# Patient Record
Sex: Male | Born: 1965 | Race: White | Hispanic: No | Marital: Single | Smoking: Former smoker
Health system: Southern US, Community
[De-identification: ages and names within clinical notes are randomized; demographics above are authoritative.]

## PROBLEM LIST (undated history)

## (undated) DIAGNOSIS — F419 Anxiety disorder, unspecified: Secondary | ICD-10-CM

## (undated) DIAGNOSIS — I10 Essential (primary) hypertension: Secondary | ICD-10-CM

## (undated) DIAGNOSIS — S30822A Blister (nonthermal) of penis, initial encounter: Secondary | ICD-10-CM

## (undated) DIAGNOSIS — F32A Depression, unspecified: Secondary | ICD-10-CM

## (undated) DIAGNOSIS — F329 Major depressive disorder, single episode, unspecified: Secondary | ICD-10-CM

## (undated) HISTORY — PX: NO PAST SURGERIES: SHX2092

---

## 2014-12-09 ENCOUNTER — Encounter (HOSPITAL_COMMUNITY): Payer: Self-pay | Admitting: Emergency Medicine

## 2014-12-09 DIAGNOSIS — I10 Essential (primary) hypertension: Secondary | ICD-10-CM | POA: Insufficient documentation

## 2014-12-09 DIAGNOSIS — Z79899 Other long term (current) drug therapy: Secondary | ICD-10-CM | POA: Insufficient documentation

## 2014-12-09 DIAGNOSIS — F1310 Sedative, hypnotic or anxiolytic abuse, uncomplicated: Secondary | ICD-10-CM | POA: Insufficient documentation

## 2014-12-09 DIAGNOSIS — F419 Anxiety disorder, unspecified: Secondary | ICD-10-CM | POA: Insufficient documentation

## 2014-12-09 NOTE — ED Notes (Signed)
Pt states he had episode of anxiousness, paranoia, and depression. Pt states when he gets like this he is likely to hurt himself. Pt does not have a plan at this time.

## 2014-12-10 ENCOUNTER — Encounter (HOSPITAL_COMMUNITY): Payer: Self-pay | Admitting: Behavioral Health

## 2014-12-10 ENCOUNTER — Emergency Department (HOSPITAL_COMMUNITY)
Admission: EM | Admit: 2014-12-10 | Discharge: 2014-12-10 | Disposition: A | Discharge status: Other Acute Inpatient Hospital | Payer: Self-pay | Attending: Emergency Medicine | Admitting: Emergency Medicine

## 2014-12-10 ENCOUNTER — Inpatient Hospital Stay (HOSPITAL_COMMUNITY)
Admission: EM | Admit: 2014-12-10 | Discharge: 2014-12-16 | DRG: 885 | Disposition: A | Discharge status: Home / Self Care | Payer: 59 | Source: Intra-hospital | Attending: Psychiatry | Admitting: Psychiatry

## 2014-12-10 DIAGNOSIS — F1099 Alcohol use, unspecified with unspecified alcohol-induced disorder: Secondary | ICD-10-CM

## 2014-12-10 DIAGNOSIS — Z818 Family history of other mental and behavioral disorders: Secondary | ICD-10-CM

## 2014-12-10 DIAGNOSIS — F1021 Alcohol dependence, in remission: Secondary | ICD-10-CM | POA: Diagnosis present

## 2014-12-10 DIAGNOSIS — F1320 Sedative, hypnotic or anxiolytic dependence, uncomplicated: Secondary | ICD-10-CM | POA: Diagnosis present

## 2014-12-10 DIAGNOSIS — I10 Essential (primary) hypertension: Secondary | ICD-10-CM | POA: Diagnosis present

## 2014-12-10 DIAGNOSIS — F411 Generalized anxiety disorder: Secondary | ICD-10-CM | POA: Diagnosis present

## 2014-12-10 DIAGNOSIS — Z590 Homelessness: Secondary | ICD-10-CM

## 2014-12-10 DIAGNOSIS — Z87891 Personal history of nicotine dependence: Secondary | ICD-10-CM

## 2014-12-10 DIAGNOSIS — F333 Major depressive disorder, recurrent, severe with psychotic symptoms: Principal | ICD-10-CM | POA: Diagnosis present

## 2014-12-10 DIAGNOSIS — R7989 Other specified abnormal findings of blood chemistry: Secondary | ICD-10-CM | POA: Clinically undetermined

## 2014-12-10 DIAGNOSIS — E785 Hyperlipidemia, unspecified: Secondary | ICD-10-CM | POA: Diagnosis present

## 2014-12-10 DIAGNOSIS — F1390 Sedative, hypnotic, or anxiolytic use, unspecified, uncomplicated: Secondary | ICD-10-CM | POA: Diagnosis not present

## 2014-12-10 DIAGNOSIS — R443 Hallucinations, unspecified: Secondary | ICD-10-CM

## 2014-12-10 DIAGNOSIS — G4700 Insomnia, unspecified: Secondary | ICD-10-CM | POA: Diagnosis present

## 2014-12-10 DIAGNOSIS — R45851 Suicidal ideations: Secondary | ICD-10-CM

## 2014-12-10 DIAGNOSIS — F410 Panic disorder [episodic paroxysmal anxiety] without agoraphobia: Secondary | ICD-10-CM | POA: Diagnosis present

## 2014-12-10 HISTORY — DX: Blister (nonthermal) of penis, initial encounter: S30.822A

## 2014-12-10 HISTORY — DX: Essential (primary) hypertension: I10

## 2014-12-10 HISTORY — DX: Anxiety disorder, unspecified: F41.9

## 2014-12-10 HISTORY — DX: Major depressive disorder, single episode, unspecified: F32.9

## 2014-12-10 HISTORY — DX: Depression, unspecified: F32.A

## 2014-12-10 LAB — CBC WITH DIFFERENTIAL/PLATELET
BASOS PCT: 1 % (ref 0–1)
Basophils Absolute: 0.1 10*3/uL (ref 0.0–0.1)
EOS ABS: 0.5 10*3/uL (ref 0.0–0.7)
EOS PCT: 5 % (ref 0–5)
HCT: 43.2 % (ref 39.0–52.0)
Hemoglobin: 15 g/dL (ref 13.0–17.0)
Lymphocytes Relative: 31 % (ref 12–46)
Lymphs Abs: 3.4 10*3/uL (ref 0.7–4.0)
MCH: 33.9 pg (ref 26.0–34.0)
MCHC: 34.7 g/dL (ref 30.0–36.0)
MCV: 97.5 fL (ref 78.0–100.0)
MONOS PCT: 8 % (ref 3–12)
Monocytes Absolute: 0.9 10*3/uL (ref 0.1–1.0)
NEUTROS ABS: 5.9 10*3/uL (ref 1.7–7.7)
Neutrophils Relative %: 55 % (ref 43–77)
PLATELETS: 225 10*3/uL (ref 150–400)
RBC: 4.43 MIL/uL (ref 4.22–5.81)
RDW: 12.9 % (ref 11.5–15.5)
WBC: 10.8 10*3/uL — AB (ref 4.0–10.5)

## 2014-12-10 LAB — BASIC METABOLIC PANEL
Anion gap: 10 (ref 5–15)
BUN: 21 mg/dL — ABNORMAL HIGH (ref 6–20)
CO2: 24 mmol/L (ref 22–32)
Calcium: 9 mg/dL (ref 8.9–10.3)
Chloride: 105 mmol/L (ref 101–111)
Creatinine, Ser: 1.15 mg/dL (ref 0.61–1.24)
Glucose, Bld: 95 mg/dL (ref 65–99)
POTASSIUM: 3.6 mmol/L (ref 3.5–5.1)
Sodium: 139 mmol/L (ref 135–145)

## 2014-12-10 LAB — RAPID URINE DRUG SCREEN, HOSP PERFORMED
Amphetamines: NOT DETECTED
Barbiturates: NOT DETECTED
Benzodiazepines: POSITIVE — AB
Cocaine: NOT DETECTED
Opiates: NOT DETECTED
Tetrahydrocannabinol: NOT DETECTED

## 2014-12-10 LAB — ACETAMINOPHEN LEVEL: Acetaminophen (Tylenol), Serum: <10 ug/mL — ABNORMAL LOW (ref 10–30)

## 2014-12-10 LAB — SALICYLATE LEVEL

## 2014-12-10 LAB — ETHANOL

## 2014-12-10 MED ORDER — HYDROXYZINE HCL 25 MG PO TABS
25.0000 mg | ORAL_TABLET | Freq: Four times a day (QID) | ORAL | Status: DC | PRN
  Administered 2014-12-10 – 2014-12-15 (×6): 25 mg via ORAL
  Filled 2014-12-10 (×3): qty 1
  Filled 2014-12-10: qty 20
  Filled 2014-12-10 (×3): qty 1

## 2014-12-10 MED ORDER — LOSARTAN POTASSIUM 50 MG PO TABS
100.0000 mg | ORAL_TABLET | Freq: Every day | ORAL | Status: DC
  Administered 2014-12-10 – 2014-12-16 (×7): 100 mg via ORAL
  Filled 2014-12-10 (×2): qty 2
  Filled 2014-12-10: qty 28
  Filled 2014-12-10 (×6): qty 2

## 2014-12-10 MED ORDER — CARVEDILOL 12.5 MG PO TABS
12.5000 mg | ORAL_TABLET | Freq: Two times a day (BID) | ORAL | Status: DC
  Administered 2014-12-10 – 2014-12-16 (×13): 12.5 mg via ORAL
  Filled 2014-12-10 (×8): qty 1
  Filled 2014-12-10: qty 28
  Filled 2014-12-10 (×7): qty 1
  Filled 2014-12-10: qty 28

## 2014-12-10 MED ORDER — FLUOXETINE HCL 20 MG PO TABS
20.0000 mg | ORAL_TABLET | Freq: Every day | ORAL | Status: DC
  Administered 2014-12-10 – 2014-12-11 (×2): 20 mg via ORAL
  Filled 2014-12-10 (×3): qty 1

## 2014-12-10 MED ORDER — ALUM & MAG HYDROXIDE-SIMETH 200-200-20 MG/5ML PO SUSP: 30.0000 mL | ORAL | Status: DC | PRN

## 2014-12-10 MED ORDER — CHLORDIAZEPOXIDE HCL 25 MG PO CAPS
25.0000 mg | ORAL_CAPSULE | Freq: Four times a day (QID) | ORAL | Status: DC | PRN
  Administered 2014-12-14: 25 mg via ORAL
  Filled 2014-12-10: qty 1

## 2014-12-10 MED ORDER — MAGNESIUM HYDROXIDE 400 MG/5ML PO SUSP: 30.0000 mL | Freq: Every day | ORAL | Status: DC | PRN

## 2014-12-10 MED ORDER — TRAZODONE HCL 50 MG PO TABS: 50.0000 mg | ORAL_TABLET | Freq: Every evening | ORAL | Status: DC | PRN

## 2014-12-10 MED ORDER — VITAMIN B-1 100 MG PO TABS
100.0000 mg | ORAL_TABLET | Freq: Every day | ORAL | Status: DC
  Administered 2014-12-10 – 2014-12-16 (×7): 100 mg via ORAL
  Filled 2014-12-10: qty 14
  Filled 2014-12-10 (×8): qty 1

## 2014-12-10 MED ORDER — HYDROCHLOROTHIAZIDE 12.5 MG PO CAPS
12.5000 mg | ORAL_CAPSULE | Freq: Every day | ORAL | Status: DC
  Administered 2014-12-10 – 2014-12-16 (×7): 12.5 mg via ORAL
  Filled 2014-12-10 (×13): qty 1

## 2014-12-10 MED ORDER — ACETAMINOPHEN 325 MG PO TABS
650.0000 mg | ORAL_TABLET | Freq: Four times a day (QID) | ORAL | Status: DC | PRN
  Administered 2014-12-13: 650 mg via ORAL
  Filled 2014-12-10: qty 2

## 2014-12-10 NOTE — ED Notes (Addendum)
Pt. Tearful. Pt. Reports that he was recently discharged from [facility] for detox. Pt. Currently living at a halfway house. Pt. Reports that he was cleaning the kitchen and overheard some other residents talking about the kitchen. Pt. Reports he suddenly became anxious and paranoid and heard a male voice in his ear. Pt. Reports then having thoughts of cutting his pinky finger off.

## 2014-12-10 NOTE — Progress Notes (Signed)
49 y/o who presents voluntarily for SI, auditory hallucinations, and hx of substance abuse.  Patient reports that he was recently discharged from [facility] for detox and currently living at a halfway house. Pt. Reports that he was cleaning the kitchen and overheard some other residents talking about the kitchen. Pt. Reports he suddenly became anxious and paranoid and heard a male voice in his ear. Pt. Reports then having thoughts of cutting his pinky finger off.  Patient states he has not heard voices since 2003.  Patient states he has been clean from drinking ETOH and using xanax since November 20, 2014.  Patient currently denies SI but states he was on admission to [facility].  Patient denies HI and states she hears a woman's voice telling him to harm himself.   Patient states he had a sister who is supportive.  Patient skin searched and he has a sore under Penis that he states is healing.  Patient states the Doctor at [facility] drew blood and states it was not an STD. Consents obtained, fall safety plan explained and patient verbalized understanding.  Food and fluids offered.  Patient belongings secured in locker #2.  Patient escorted and oriented to the unit.  Patient offered no additional questions or concerns.

## 2014-12-10 NOTE — [facility] Group Notes (Signed)
[facility] LCSW Aftercare Discharge Planning Group Note   12/10/2014 10:59 AM  Participation Quality:  Invited.  In bed asleep.    Daryel Gerald B

## 2014-12-10 NOTE — Progress Notes (Signed)
D: Pt has anxious affect and mood.  Pt reports his day has been "very good."  Pt reports he did not have a goal today, stating "I got in early in the morning."  Pt reports "I had a much better day, I've felt really good."  Pt denies SI/HI, denies hallucinations, denies pain.  Pt stayed in his room for the majority of the evening.  He did not attend evening group.  Pt denies withdrawal symptoms.     A: Introduced self to pt.  Met with pt 1:1 and provided support and encouragement.  Actively listened to pt.  PRN medication administered for anxiety. R: Pt is compliant with medications.  Pt verbally contracts for safety.  He reports he will notify staff of needs and concerns.  Will continue to monitor and assess.

## 2014-12-10 NOTE — Progress Notes (Signed)
Patient ID: Ansumana Gens, male   DOB: 1965/12/12, 49 y.o.   MRN: [id number]   Pt currently presents with a flat affect and euthymic behavior. Pt states "I'm doing better today, I had an episode of paranoia last night. I don't know what happened. Pt only complains of a small healing ulcer on the bottom of his penis. Pt denies pain/drianage or erythema.   Pt provided with medications per providers orders. Pt's labs and vitals were monitored throughout the day. Pt supported emotionally and encouraged to express concerns and questions. Pt educated on medications and diet/nutrition. MD, NP and writer consulted with pt about ulcer on pt's penis. UA drawn and labs scheduled for STI tests. SW notified that the director of [employer] called to inquire about pt.   Pt's safety ensured with 15 minute and environmental checks. Pt currently denies SI/HI and A/V hallucinations. Pt verbally agrees to seek staff if SI/HI or A/VH occurs and to consult with staff before acting on these thoughts. Will continue POC.
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
## 2014-12-10 NOTE — H&P (Signed)
Psychiatric Admission Assessment Adult  Patient Identification: Ruben Marks MRN:  [id number] Date of Evaluation:  12/10/2014 Chief Complaint:  Depressive Disorder Principal Diagnosis: MDD (major depressive disorder), recurrent, severe, with psychosis Diagnosis:   Patient Active Problem List   Diagnosis Date Noted  . MDD (major depressive disorder), recurrent, severe, with psychosis [F33.3] 12/10/2014  . GAD (generalized anxiety disorder) [F41.1] 12/10/2014  . Alcohol use disorder, severe, in early remission [F10.99] 12/10/2014  . Moderate benzodiazepine use disorder [F13.90] 12/10/2014      History of Present Illness::Ruben Marks is a 49 y.o.divorced white male , homeless , unemployed - was living at the [facility] in [place] since Tuesday June 15 th , who was brought to the [facility] at his own request by his house director at the recovery house .  Per initial notes in EHR " Pt reported that he has recently been IP at [facility] for alcohol detox beginning on 11/20/14. Pt reported having anxiety symptoms, similar to a panic attack, when he overheard two housemates talking and assumed they were discussing him. Pt reported that for the last two days he has been fighting the urge to cut his "pinkie finger" off. Pt reported that after hearing his housemates talking, he began having SI and thinking about cutting his wrists. Pt denies HI. Pt reported that he was hearing "a male voice" telling him that his housemates were talking about him and that he should kill himself. Pt reported hearing the same male voice several times before but years apart, the first time occuring in 1998. Pt denies any substances use since his recent hospital discharge. Pt has most symptoms of depression and many of anxiety. ETOH level when tested tonight was <5. UDS showed positive for Benzos; negative for all else. "  Patient seen this after noon. Pt appeared to be depressed , anxious. Pt reported  that he started feeling very paranoid to the point that he heard a male voice telling him that his house mates were talking about him. Pt reports that his last use of alcohol was a was prior to going to [place] and also his last use of BZD was 2 weeks ago , while at [place]. Pt reports that he was taking Prozac and trazodone as prescribed by his psychiatrist at [facility] - but he was feeling very tired , sad, slowed down, low energy , as well as tearfulness on and off since the past 1 week. Pt also reports feeling like "death wishes " so that this feeling would end. Patient also reports a hx of GAD- has worry about everything in general as well as racing heart , tremors and chest pain and SOB as well as panic like sx on and off .  Pt reports hearing AH may be a few times in his life and the last one was recently prior to admission.  Pt denies suicide attempts in the past . Pt did have another admission prior to being admitted at [facility] - this was at [facility] - 2012.  Pt is unemployed - homeless- used to live at his sister's house - cannot go back due to his erratic behavior. Pt reports he still has a bed available at [facility] .        Elements:  Location:  depression , anxiety,paranoia. Quality:  see above. Severity:  severe. Timing:  acute. Duration:  diagnosed with GAD 20 yrs ago , recent worsening of affective sx. Context:  depression, anxiety along with substance abuse. Associated  Signs/Symptoms: Depression Symptoms:  depressed mood, anhedonia, psychomotor retardation, fatigue, feelings of worthlessness/guilt, difficulty concentrating, hopelessness, recurrent thoughts of death, anxiety, panic attacks, loss of energy/fatigue, decreased appetite, (Hypo) Manic Symptoms:  Impulsivity, Anxiety Symptoms:  Excessive Worry, Psychotic Symptoms:  Delusions, Hallucinations: Auditory Paranoia, PTSD Symptoms: Negative Total Time spent with patient: 1 hour  Past  Medical History:  Past Medical History  Diagnosis Date  . Hypertension   . 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 Anxiety   . Depression    History reviewed. No pertinent past surgical history. Family History:  Family History  Problem Relation Age of Onset  . Anxiety disorder Mother    Social History:  History  Alcohol Use  . Yes    Comment: just 11/20/2014     History  Drug Use  . Yes  . Special: Benzodiazepines    History   Social History  . Marital Status: Unknown    Spouse Name: N/A  . Number of Children: N/A  . Years of Education: N/A   Social History Main Topics  . Smoking status: Former Research scientist (life sciences)  . Smokeless tobacco: Not on file  . Alcohol Use: Yes     Comment: just 11/20/2014  . Drug Use: Yes    Special: Benzodiazepines  . Sexual Activity: No   Other Topics Concern  . None   Social History Narrative   Additional Social History:    Pain Medications: n/a Prescriptions: see pta med list History of alcohol / drug use?: Yes Longest period of sobriety (when/how long): 8.5 years Negative Consequences of Use: Work / Youth worker, Museum/gallery curator, Scientist, research (physical sciences), Personal relationships Withdrawal Symptoms: Sweats Name of Substance 1: Alcohol 1 - Age of First Use: 17 1 - Amount (size/oz): minimum 1 pint of 151 and 4-5 hard lemonaides 1 - Frequency: daily 1 - Duration: years 1 - Last Use / Amount: 11/20/14 Name of Substance 2: Xanax 2 - Age of First Use: 29 2 - Amount (size/oz): "varied" 2 - Frequency: daily 2 - Duration: years 2 - Last Use / Amount: May 2016 Name of Substance 3: Benzos (Valium) 3 - Age of First Use: 29 3 - Amount (size/oz): "varied" 3 - Frequency: daily 3 - Duration: years 3 - Last Use / Amount: 11/20/14            Patient was born in [place]. Patient was raised by both parents . Pt dropped out of school got GED. Pt is divorced, reports having no children , is unemployed .   Musculoskeletal: Strength & Muscle Tone: within normal limits Gait & Station:  normal Patient leans: N/A  Psychiatric Specialty Exam: Physical Exam  Constitutional: He is oriented to person, place, and time. He appears well-developed and well-nourished.  HENT:  Head: Normocephalic and atraumatic.  Eyes: Conjunctivae and EOM are normal.  Neck: Normal range of motion. Neck supple. No thyromegaly present.  Cardiovascular: Normal rate and regular rhythm.   Respiratory: Effort normal.  GI: Soft. He exhibits no distension.  Genitourinary: Penis normal.  HAS A RASH ON THE SIDE OF THE PENIS - reports it as improving  Musculoskeletal: Normal range of motion.  Neurological: He is alert and oriented to person, place, and time.  Skin: Skin is warm. Rash noted.  Psychiatric: His mood appears anxious. His speech is delayed. He is slowed. Thought content is paranoid. Cognition and memory are normal. He expresses impulsivity. He exhibits a depressed mood. He expresses suicidal ideation.    Review of Systems  Skin: Positive for rash.  Psychiatric/Behavioral: Positive for depression, suicidal ideas and substance abuse. The patient is nervous/anxious and has insomnia.   All other systems reviewed and are negative.   Blood pressure 129/87, pulse 81, temperature 97.7 F (36.5 C), temperature source Oral, resp. rate 20, height _0  (1.905 m), weight 109.317 kg (241 lb), SpO2 97 %.Body mass index is 30.12 kg/(m^2).  General Appearance: Disheveled  Eye Sport and exercise psychologist::  Fair  Speech:  Normal Rate  Volume:  Normal  Mood:  Anxious and Depressed  Affect:  Congruent  Thought Process:  Goal Directed  Orientation:  Full (Time, Place, and Person)  Thought Content:  Delusions, Hallucinations: Auditory, Ideas of Reference:   Paranoia, Paranoid Ideation and Rumination  Suicidal Thoughts:  has death wishes  Homicidal Thoughts:  No  Memory:  Immediate;   Fair Recent;   Fair Remote;   Fair  Judgement:  Impaired  Insight:  Fair  Psychomotor Activity:  Normal  Concentration:  Fair  Recall:   [employer] of Knowledge:Fair  Language: Fair  Akathisia:  No  Handed:  Right  AIMS (if indicated):     Assets:  Communication Skills Desire for Improvement  ADL's:  Intact  Cognition: WNL  Sleep:      Risk to Self: Is patient at risk for suicide?: Yes What has been your use of drugs/alcohol within the last 12 months?: daily until Pt was detoxed on 11/20/14 Risk to Others:  paranoid , potential Prior Inpatient Therapy:  yes- as above Prior Outpatient Therapy:  yes -   Alcohol Screening: 1. How often do you have a drink containing alcohol?: Monthly or less 2. How many drinks containing alcohol do you have on a typical day when you are drinking?: 1 or 2 3. How often do you have six or more drinks on one occasion?: Never Preliminary Score: 0 9. Have you or someone else been injured as a result of your drinking?: No 10. Has a relative or friend or a doctor or another health worker been concerned about your drinking or suggested you cut down?: Yes, but not in the last year Alcohol Use Disorder Identification Test Final Score (AUDIT): 3 Brief Intervention: Patient declined brief intervention  Allergies:  No Known Allergies Lab Results:  Results for orders placed or performed during the hospital encounter of 12/10/14 (from the past 48 hour(s))  Urine rapid drug screen (hosp performed)     Status: Abnormal   Collection Time: 12/10/14 12:18 AM  Result Value Ref Range   Opiates NONE DETECTED NONE DETECTED   Cocaine NONE DETECTED NONE DETECTED   Benzodiazepines POSITIVE (A) NONE DETECTED   Amphetamines NONE DETECTED NONE DETECTED   Tetrahydrocannabinol NONE DETECTED NONE DETECTED   Barbiturates NONE DETECTED NONE DETECTED    Comment:        DRUG SCREEN FOR MEDICAL PURPOSES ONLY.  IF CONFIRMATION IS NEEDED FOR ANY PURPOSE, NOTIFY LAB WITHIN 5 DAYS.        LOWEST DETECTABLE LIMITS FOR URINE DRUG SCREEN Drug Class       Cutoff (ng/mL) Amphetamine      1000 Barbiturate       200 Benzodiazepine   379 Tricyclics       024 Opiates          300 Cocaine          300 THC              50   CBC with Differential     Status: Abnormal  Collection Time: 12/10/14 12:30 AM  Result Value Ref Range   WBC 10.8 (H) 4.0 - 10.5 K/uL   RBC 4.43 4.22 - 5.81 MIL/uL   Hemoglobin 15.0 13.0 - 17.0 g/dL   HCT 43.2 39.0 - 52.0 %   MCV 97.5 78.0 - 100.0 fL   MCH 33.9 26.0 - 34.0 pg   MCHC 34.7 30.0 - 36.0 g/dL   RDW 12.9 11.5 - 15.5 %   Platelets 225 150 - 400 K/uL   Neutrophils Relative % 55 43 - 77 %   Neutro Abs 5.9 1.7 - 7.7 K/uL   Lymphocytes Relative 31 12 - 46 %   Lymphs Abs 3.4 0.7 - 4.0 K/uL   Monocytes Relative 8 3 - 12 %   Monocytes Absolute 0.9 0.1 - 1.0 K/uL   Eosinophils Relative 5 0 - 5 %   Eosinophils Absolute 0.5 0.0 - 0.7 K/uL   Basophils Relative 1 0 - 1 %   Basophils Absolute 0.1 0.0 - 0.1 K/uL  Basic metabolic panel     Status: Abnormal   Collection Time: 12/10/14 12:30 AM  Result Value Ref Range   Sodium 139 135 - 145 mmol/L   Potassium 3.6 3.5 - 5.1 mmol/L   Chloride 105 101 - 111 mmol/L   CO2 24 22 - 32 mmol/L   Glucose, Bld 95 65 - 99 mg/dL   BUN 21 (H) 6 - 20 mg/dL   Creatinine, Ser 1.15 0.61 - 1.24 mg/dL   Calcium 9.0 8.9 - 10.3 mg/dL   GFR calc non Af Amer >60 >60 mL/min   GFR calc Af Amer >60 >60 mL/min    Comment: (NOTE) The eGFR has been calculated using the CKD EPI equation. This calculation has not been validated in all clinical situations. eGFR's persistently <60 mL/min signify possible Chronic Kidney Disease.    Anion gap 10 5 - 15  Ethanol     Status: None   Collection Time: 12/10/14 12:30 AM  Result Value Ref Range   Alcohol, Ethyl (B) <5 <5 mg/dL    Comment:        LOWEST DETECTABLE LIMIT FOR SERUM ALCOHOL IS 5 mg/dL FOR MEDICAL PURPOSES ONLY   Salicylate level     Status: None   Collection Time: 12/10/14 12:30 AM  Result Value Ref Range   Salicylate Lvl <1.6 2.8 - 30.0 mg/dL  Acetaminophen level     Status:  Abnormal   Collection Time: 12/10/14 12:30 AM  Result Value Ref Range   Acetaminophen (Tylenol), Serum <10 (L) 10 - 30 ug/mL    Comment:        THERAPEUTIC CONCENTRATIONS VARY SIGNIFICANTLY. A RANGE OF 10-30 ug/mL MAY BE AN EFFECTIVE CONCENTRATION FOR MANY PATIENTS. HOWEVER, SOME ARE BEST TREATED AT CONCENTRATIONS OUTSIDE THIS RANGE. ACETAMINOPHEN CONCENTRATIONS >150 ug/mL AT 4 HOURS AFTER INGESTION AND >50 ug/mL AT 12 HOURS AFTER INGESTION ARE OFTEN ASSOCIATED WITH TOXIC REACTIONS.    Current Medications: Current Facility-Administered Medications  Medication Dose Route Frequency Provider Last Rate Last Dose  . acetaminophen (TYLENOL) tablet 650 mg  650 mg Oral Q6H PRN Laverle Hobby, PA-C      . alum & mag hydroxide-simeth (MAALOX/MYLANTA) 200-200-20 MG/5ML suspension 30 mL  30 mL Oral Q4H PRN Laverle Hobby, PA-C      . carvedilol (COREG) tablet 12.5 mg  12.5 mg Oral BID WC Laverle Hobby, PA-C   12.5 mg at 12/10/14 0841  . chlordiazePOXIDE (LIBRIUM) capsule 25 mg  25  mg Oral QID PRN Ursula Alert, MD      . FLUoxetine (PROZAC) tablet 20 mg  20 mg Oral Daily Laverle Hobby, PA-C   20 mg at 12/10/14 0841  . hydrochlorothiazide (MICROZIDE) capsule 12.5 mg  12.5 mg Oral Daily Vaden Becherer, MD   12.5 mg at 12/10/14 0841  . hydrOXYzine (ATARAX/VISTARIL) tablet 25 mg  25 mg Oral Q6H PRN Laverle Hobby, PA-C      . losartan (COZAAR) tablet 100 mg  100 mg Oral Daily Laverle Hobby, PA-C   100 mg at 12/10/14 0841  . magnesium hydroxide (MILK OF MAGNESIA) suspension 30 mL  30 mL Oral Daily PRN Laverle Hobby, PA-C      . thiamine (VITAMIN B-1) tablet 100 mg  100 mg Oral Daily Laverle Hobby, PA-C   100 mg at 12/10/14 7829   PTA Medications: Prescriptions prior to admission  Medication Sig Dispense Refill Last Dose  . carvedilol (COREG) 12.5 MG tablet Take 12.5 mg by mouth 2 (two) times daily with a meal.   12/09/2014 at Unknown time  . FLUoxetine (PROZAC) 10 MG capsule Take 10  mg by mouth daily.   12/09/2014  . losartan-hydrochlorothiazide (HYZAAR) 100-12.5 MG per tablet Take 1 tablet by mouth daily.   12/09/2014 at Unknown time  . thiamine 100 MG tablet Take 100 mg by mouth daily.   12/09/2014 at Unknown time  . traZODone (DESYREL) 50 MG tablet Take 50 mg by mouth at bedtime.   12/09/2014 at Unknown time    Previous Psychotropic Medications: Yes - PROZAC, TRAZODONE  Substance Abuse History in the last 12 months:  Yes.  Alcohol abuse, bzd abuse     Consequences of Substance Abuse: Medical Consequences:  admissions to hospitals Family Consequences:  relational struggles  Results for orders placed or performed during the hospital encounter of 12/10/14 (from the past 72 hour(s))  Urine rapid drug screen (hosp performed)     Status: Abnormal   Collection Time: 12/10/14 12:18 AM  Result Value Ref Range   Opiates NONE DETECTED NONE DETECTED   Cocaine NONE DETECTED NONE DETECTED   Benzodiazepines POSITIVE (A) NONE DETECTED   Amphetamines NONE DETECTED NONE DETECTED   Tetrahydrocannabinol NONE DETECTED NONE DETECTED   Barbiturates NONE DETECTED NONE DETECTED    Comment:        DRUG SCREEN FOR MEDICAL PURPOSES ONLY.  IF CONFIRMATION IS NEEDED FOR ANY PURPOSE, NOTIFY LAB WITHIN 5 DAYS.        LOWEST DETECTABLE LIMITS FOR URINE DRUG SCREEN Drug Class       Cutoff (ng/mL) Amphetamine      1000 Barbiturate      200 Benzodiazepine   562 Tricyclics       130 Opiates          300 Cocaine          300 THC              50   CBC with Differential     Status: Abnormal   Collection Time: 12/10/14 12:30 AM  Result Value Ref Range   WBC 10.8 (H) 4.0 - 10.5 K/uL   RBC 4.43 4.22 - 5.81 MIL/uL   Hemoglobin 15.0 13.0 - 17.0 g/dL   HCT 43.2 39.0 - 52.0 %   MCV 97.5 78.0 - 100.0 fL   MCH 33.9 26.0 - 34.0 pg   MCHC 34.7 30.0 - 36.0 g/dL   RDW 12.9 11.5 - 15.5 %  Platelets 225 150 - 400 K/uL   Neutrophils Relative % 55 43 - 77 %   Neutro Abs 5.9 1.7 - 7.7 K/uL    Lymphocytes Relative 31 12 - 46 %   Lymphs Abs 3.4 0.7 - 4.0 K/uL   Monocytes Relative 8 3 - 12 %   Monocytes Absolute 0.9 0.1 - 1.0 K/uL   Eosinophils Relative 5 0 - 5 %   Eosinophils Absolute 0.5 0.0 - 0.7 K/uL   Basophils Relative 1 0 - 1 %   Basophils Absolute 0.1 0.0 - 0.1 K/uL  Basic metabolic panel     Status: Abnormal   Collection Time: 12/10/14 12:30 AM  Result Value Ref Range   Sodium 139 135 - 145 mmol/L   Potassium 3.6 3.5 - 5.1 mmol/L   Chloride 105 101 - 111 mmol/L   CO2 24 22 - 32 mmol/L   Glucose, Bld 95 65 - 99 mg/dL   BUN 21 (H) 6 - 20 mg/dL   Creatinine, Ser 1.15 0.61 - 1.24 mg/dL   Calcium 9.0 8.9 - 10.3 mg/dL   GFR calc non Af Amer >60 >60 mL/min   GFR calc Af Amer >60 >60 mL/min    Comment: (NOTE) The eGFR has been calculated using the CKD EPI equation. This calculation has not been validated in all clinical situations. eGFR's persistently <60 mL/min signify possible Chronic Kidney Disease.    Anion gap 10 5 - 15  Ethanol     Status: None   Collection Time: 12/10/14 12:30 AM  Result Value Ref Range   Alcohol, Ethyl (B) <5 <5 mg/dL    Comment:        LOWEST DETECTABLE LIMIT FOR SERUM ALCOHOL IS 5 mg/dL FOR MEDICAL PURPOSES ONLY   Salicylate level     Status: None   Collection Time: 12/10/14 12:30 AM  Result Value Ref Range   Salicylate Lvl <1.2 2.8 - 30.0 mg/dL  Acetaminophen level     Status: Abnormal   Collection Time: 12/10/14 12:30 AM  Result Value Ref Range   Acetaminophen (Tylenol), Serum <10 (L) 10 - 30 ug/mL    Comment:        THERAPEUTIC CONCENTRATIONS VARY SIGNIFICANTLY. A RANGE OF 10-30 ug/mL MAY BE AN EFFECTIVE CONCENTRATION FOR MANY PATIENTS. HOWEVER, SOME ARE BEST TREATED AT CONCENTRATIONS OUTSIDE THIS RANGE. ACETAMINOPHEN CONCENTRATIONS >150 ug/mL AT 4 HOURS AFTER INGESTION AND >50 ug/mL AT 12 HOURS AFTER INGESTION ARE OFTEN ASSOCIATED WITH TOXIC REACTIONS.     Observation Level/Precautions:  15 minute checks   Laboratory:  .  Psychotherapy:  Individual and group therapy   Medications:  As below  Consultations:  Education officer, museum  Discharge Concerns: stability and safety        Psychological Evaluations: No   Treatment Plan Summary: Daily contact with patient to assess and evaluate symptoms and progress in treatment and Medication management  Patient will benefit from inpatient treatment and stabilization.  Estimated length of stay is 5-7 days.  Reviewed past medical records,treatment plan.  Patient would like to be continues on Prozac 20 mg for affective sx. Will DC Trazodone for side effects. Will assess the need for an antipsychotic tomorrow- pt currently denies paranoia- reports it is better. CIWA/Librium prn for alcohol, BZD withdrawal sx- pt with UDS positive for BZD - reports he did not abuse it after [facility]. Will continue to monitor vitals ,medication compliance and treatment side effects while patient is here.  Will monitor for medical issues as  well as call consult as needed.  Reviewed labs CBC, CMP,UDS,BAL- will get RPR,GC/CHL, HIV, tsh. CSW will start working on disposition.  Patient to participate in therapeutic milieu .       Medical Decision Making:  Review of Psycho-Social Stressors (1), Review or order clinical lab tests (1), Review and summation of old records (2), Established Problem, Worsening (2), Review of Last Therapy Session (1), Review of Medication Regimen & Side Effects (2) and Review of New Medication or Change in Dosage (2)  I certify that inpatient services furnished can reasonably be expected to improve the patient's condition.   Daeshawn Redmann MD 6/22/20164:13 PM

## 2014-12-10 NOTE — [facility] Counselor (Signed)
Adult Comprehensive Assessment  Patient ID: Ruben Marks, male   DOB: 31-Jul-1965, 49 y.o.   MRN: [id number]  Information Source: Information source: Patient  Current Stressors:  Educational / Learning stressors: N/A Employment / Job issues: Currently unemployed Family Relationships: None reported  Surveyor, quantity / Lack of resources (include bankruptcy): No income currently Housing / Lack of housing: Living at [facility] Physical health (include injuries & life threatening diseases): None reported Social relationships: none reported Substance abuse: Pt states he has been sober since June 2, living at [facility] Bereavement / Loss: none reported  Living/Environment/Situation:  Living Arrangements: Other (Comment) (residents at [facility]) Living conditions (as described by patient or guardian): "fine" How long has patient lived in current situation?: 1 week; prior to living at [facility], Pt was living with his sister in [place], [place] What is atmosphere in current home: Supportive  Family History:  Marital status: Divorced Divorced, when?: 07/2014; Separated since 2012 What types of issues is patient dealing with in the relationship?: Pt reports that he still loves his ex-wife and it causes him to be upset at times Does patient have children?: No  Childhood History:  By whom was/is the patient raised?: Both parents Description of patient's relationship with caregiver when they were a child: Good relationship, safe childhood Patient's description of current relationship with people who raised him/her: Pt's parents are both deceased Does patient have siblings?: Yes Number of Siblings: 3 Description of patient's current relationship with siblings: Pt reports siblings are very supportive and that he has a close relationship with them. Did patient suffer any verbal/emotional/physical/sexual abuse as a child?: No Did patient suffer from severe childhood neglect?: No Has patient ever been sexually  abused/assaulted/raped as an adolescent or adult?: No Was the patient ever a victim of a crime or a disaster?: No Witnessed domestic violence?: No Has patient been effected by domestic violence as an adult?: No  Education:  Highest grade of school patient has completed: 12th Currently a student?: No Name of school: [employer] Learning disability?: No  Employment/Work Situation:   Employment situation: Unemployed What is the longest time patient has a held a job?: 7 years Where was the patient employed at that time?: [employer] Has patient ever been in the [employer]?: No Has patient ever served in Buyer, retail?: No  Financial Resources:   Surveyor, quantity resources: No income Does patient have a Lawyer or guardian?: No  Alcohol/Substance Abuse:   What has been your use of drugs/alcohol within the last 12 months?: daily until Pt was detoxed on 11/20/14 If attempted suicide, did drugs/alcohol play a role in this?: No Alcohol/Substance Abuse Treatment Hx: Past detox, Past Tx, Inpatient Has alcohol/substance abuse ever caused legal problems?: No  Social Support System:   Conservation officer, nature Support System: Good Describe Community Support System: siblings are supportive Type of faith/religion: Did not state How does patient's faith help to cope with current illness?: unknown  Leisure/Recreation:   Leisure and Hobbies: Unknown   Strengths/Needs:   What things does the patient do well?: Pt did not state In what areas does patient struggle / problems for patient: Pt did not state  Discharge Plan:   Does patient have access to transportation?: Yes Will patient be returning to same living situation after discharge?: Yes Currently receiving community mental health services:  (staff at [facility] provide services) If no, would patient like referral for services when discharged?: No Does patient have financial barriers related to discharge medications?: No  Summary/Recommendations:     Patient  is  a 49 year old Caucasian male admitted with SI and auditory hallucinations. Pt is currently living at [facility] in [facility] and reports that he can return there at discharge. Pt also stated that he is receiving mental health services at [facility].  He has a history of substance abuse but has been clean since 11/20/14 after receiving detox services at [facility]. Pt identified his sister as supportive and is agreeable to SPE with her. Patient will benefit from crisis stabilization, medication evaluation, group therapy and psycho education in addition to case management for discharge planning.     Elaina Hoops. 12/10/2014

## 2014-12-10 NOTE — Plan of Care (Signed)
Problem: Alteration in mood & ability to function due to Goal: LTG-Pt reports reduction in suicidal thoughts (Patient reports reduction in suicidal thoughts and is able to verbalize a safety plan for whenever patient is feeling suicidal)  Outcome: Progressing Pt denied SI this shift.  He verbally contracted for safety.   Goal: STG-Patient will attend groups Outcome: Not Progressing Pt did not attend evening group on 12/10/14.

## 2014-12-10 NOTE — [facility] Suicide Risk Assessment (Signed)
[facility] Admission Suicide Risk Assessment   Nursing information obtained from:  Patient Demographic factors:  Male, Unemployed, Low socioeconomic status Current Mental Status:  Suicidal ideation indicated by patient, Suicide plan, Self-harm thoughts Loss Factors:  Financial problems / change in socioeconomic status Historical Factors:  Prior suicide attempts, Family history of suicide Risk Reduction Factors:  Sense of responsibility to family, Living with another person, especially a relative, Positive social support Total Time spent with patient: 30 minutes Principal Problem: MDD (major depressive disorder), recurrent, severe, with psychosis Diagnosis:   Patient Active Problem List   Diagnosis Date Noted  . MDD (major depressive disorder), recurrent, severe, with psychosis [F33.3] 12/10/2014  . GAD (generalized anxiety disorder) [F41.1] 12/10/2014  . Alcohol use disorder, severe, in early remission [F10.99] 12/10/2014  . Moderate benzodiazepine use disorder [F13.90] 12/10/2014     Continued Clinical Symptoms:  Alcohol Use Disorder Identification Test Final Score (AUDIT): 3 The "Alcohol Use Disorders Identification Test", Guidelines for Use in Primary Care, Second Edition.  World Science writer [facility]). Score between 0-7:  no or low risk or alcohol related problems. Score between 8-15:  moderate risk of alcohol related problems. Score between 16-19:  high risk of alcohol related problems. Score 20 or above:  warrants further diagnostic evaluation for alcohol dependence and treatment.   CLINICAL FACTORS:   Depression:   Comorbid alcohol abuse/dependence Hopelessness Impulsivity Alcohol/Substance Abuse/Dependencies Previous Psychiatric Diagnoses and Treatments   Musculoskeletal: Strength & Muscle Tone: within normal limits Gait & Station: normal Patient leans: N/A  Psychiatric Specialty Exam: Physical Exam  Review of Systems  Skin: Positive for rash.       Lateral side of  penis  Psychiatric/Behavioral: Positive for depression, suicidal ideas and substance abuse. The patient is nervous/anxious and has insomnia.   All other systems reviewed and are negative.   Blood pressure 129/87, pulse 81, temperature 97.7 F (36.5 C), temperature source Oral, resp. rate 20, height 6\' 3"  (1.905 m), weight 109.317 kg (241 lb), SpO2 97 %.Body mass index is 30.12 kg/(m^2).              Please see H&P.                                            COGNITIVE FEATURES THAT CONTRIBUTE TO RISK:  Closed-mindedness, Polarized thinking and Thought constriction (tunnel vision)    SUICIDE RISK:   Moderate:  Frequent suicidal ideation with limited intensity, and duration, some specificity in terms of plans, no associated intent, good self-control, limited dysphoria/symptomatology, some risk factors present, and identifiable protective factors, including available and accessible social support.  PLAN OF CARE: Please see H&P.   Medical Decision Making:  Review of Psycho-Social Stressors (1), Review or order clinical lab tests (1), Established Problem, Worsening (2), Review of Last Therapy Session (1), Review of Medication Regimen & Side Effects (2) and Review of New Medication or Change in Dosage (2)  I certify that inpatient services furnished can reasonably be expected to improve the patient's condition.   Ilian Wessell MD 12/10/2014, 4:10 PM

## 2014-12-10 NOTE — ED Provider Notes (Addendum)
CSN: [id number]     Arrival date & time 12/09/14  2331 History  This chart was scribed for Shon Baton, MD by Abel Presto, ED Scribe. This patient was seen in room APA17/APA17 and the patient's care was started at 12:19 AM.    Chief Complaint  Patient presents with  . V70.1    The history is provided by the patient. No language interpreter was used.   HPI Comments: Ruben Marks is a 49 y.o. male with PMHx of HTN who presents to the Emergency Department complaining of episode of paranoia and auditory hallucinations. He states he has been hearing the same voice since 1998. Pt reports only PMHx of anxiety but no other psychiatric diagnoses. Pt states he has wanted to cut his finger off for 2 days. Pt was seen at [facility] for EtOH detox and kept in psych ward for 8 days. He denies drug use. He reports h/o addiction to Xanax and Valium - no recent history. Pt denies homicidal ideation. Pt has a psychiatrist. He denies any physical complaints.   Past Medical History  Diagnosis Date  . Hypertension    History reviewed. No pertinent past surgical history. History reviewed. No pertinent family history. History  Substance Use Topics  . Smoking status: Former Games developer  . Smokeless tobacco: Not on file  . Alcohol Use: No     Comment: just 11/20/2014    Review of Systems  Constitutional: Negative.  Negative for fever.  Respiratory: Negative.  Negative for chest tightness and shortness of breath.   Cardiovascular: Negative.  Negative for chest pain.  Gastrointestinal: Negative.  Negative for abdominal pain.  Genitourinary: Negative.   Skin: Negative for wound.  Neurological: Negative for headaches.  Psychiatric/Behavioral: Positive for suicidal ideas and hallucinations. Negative for self-injury and agitation. The patient is nervous/anxious.   All other systems reviewed and are negative.     Allergies  Review of patient's allergies indicates not on file.  Home Medications   Prior  to Admission medications   Medication Sig Start Date End Date Taking? Authorizing Provider  carvedilol (COREG) 12.5 MG tablet Take 12.5 mg by mouth 2 (two) times daily with a meal.   Yes Historical Provider, MD  FLUoxetine (PROZAC) 20 MG tablet Take 20 mg by mouth daily.   Yes Historical Provider, MD  losartan (COZAAR) 100 MG tablet Take 100 mg by mouth daily.   Yes Historical Provider, MD  thiamine 100 MG tablet Take 100 mg by mouth daily.   Yes Historical Provider, MD  traZODone (DESYREL) 50 MG tablet Take 50 mg by mouth at bedtime.   Yes Historical Provider, MD   BP 131/79 mmHg  Pulse 67  Temp(Src) 98.1 F (36.7 C) (Oral)  Resp 16  Ht 6\' 3"  (1.905 m)  Wt 246 lb (111.585 kg)  BMI 30.75 kg/m2  SpO2 97% Physical Exam  Constitutional: He is oriented to person, place, and time. He appears well-developed and well-nourished. No distress.  HENT:  Head: Normocephalic and atraumatic.  Cardiovascular: Normal rate, regular rhythm and normal heart sounds.   No murmur heard. Pulmonary/Chest: Effort normal and breath sounds normal. No respiratory distress. He has no wheezes.  Abdominal: Soft. There is no tenderness.  Musculoskeletal: He exhibits no edema.  Neurological: He is alert and oriented to person, place, and time.  Skin: Skin is warm and dry.  Psychiatric:  Anxious appearing, does not appear to be responding to internal stimuli  Nursing note and vitals reviewed.   ED Course  Procedures (including critical care time) DIAGNOSTIC STUDIES: Oxygen Saturation is 97% on room air, normal by my interpretation.    COORDINATION OF CARE: 12:24 AM Discussed treatment plan with patient at beside, the patient agrees with the plan and has no further questions at this time.   Labs Review Labs Reviewed  CBC WITH DIFFERENTIAL/PLATELET - Abnormal; Notable for the following:    WBC 10.8 (*)    All other components within normal limits  BASIC METABOLIC PANEL - Abnormal; Notable for the following:     BUN 21 (*)    All other components within normal limits  URINE RAPID DRUG SCREEN, HOSP PERFORMED - Abnormal; Notable for the following:    Benzodiazepines POSITIVE (*)    All other components within normal limits  ACETAMINOPHEN LEVEL - Abnormal; Notable for the following:    Acetaminophen (Tylenol), Serum <10 (*)    All other components within normal limits  ETHANOL  SALICYLATE LEVEL    Imaging Review No results found.   EKG Interpretation None      MDM   Final diagnoses:  Suicidal ideation  Hallucination    Patient presents with auditory hallucinations, anxiety, and feelings of wanting to hurt himself. [facility] Nontoxic on exam. Does not appear acutely psychotic.  Labs ordered and TTS consulted.   I personally performed the services described in this documentation, which was scribed in my presence. The recorded information has been reviewed and is accurate.     Shon Baton, MD 12/10/14 0119   Meets inpatient criteria. Accepted by Dr. Elna Breslow.  Shon Baton, MD 12/10/14 [id number]

## 2014-12-10 NOTE — ED Notes (Signed)
Pt. Calm and cooperative at time of transfer. No distress noted.

## 2014-12-10 NOTE — Tx Team (Addendum)
Initial Interdisciplinary Treatment Plan   PATIENT STRESSORS: Financial difficulties Substance abuse   PATIENT STRENGTHS: Ability for insight Communication skills General fund of knowledge Motivation for treatment/growth Supportive family/friends   PROBLEM LIST: Problem List/Patient Goals Date to be addressed Date deferred Reason deferred Estimated date of resolution  "I was hearing a male voice" 12/10/2014     "I was having thoughts of hurting myself by cutting off my pinky with an axe' 12/10/2014     Anxiety 12/10/2014     "I was living in a halfway house" 12/10/2014                                    DISCHARGE CRITERIA:  Ability to meet basic life and health needs Improved stabilization in mood, thinking, and/or behavior Motivation to continue treatment in a less acute level of care Reduction of life-threatening or endangering symptoms to within safe limits Safe-care adequate arrangements made  PRELIMINARY DISCHARGE PLAN: Attend aftercare/continuing care group Attend 12-step recovery group Return to previous living arrangement  PATIENT/FAMIILY INVOLVEMENT: This treatment plan has been presented to and reviewed with the patient, Ruben Marks.  The patient and family have been given the opportunity to ask questions and make suggestions.  [name] 12/10/2014, 6:27 AM

## 2014-12-10 NOTE — Progress Notes (Signed)
Did not attend group 

## 2014-12-11 LAB — TSH: TSH: 7.749 u[IU]/mL — ABNORMAL HIGH (ref 0.350–4.500)

## 2014-12-11 LAB — RPR: RPR Ser Ql: NONREACTIVE

## 2014-12-11 LAB — HIV ANTIBODY (ROUTINE TESTING W REFLEX): HIV Screen 4th Generation wRfx: NONREACTIVE

## 2014-12-11 LAB — GC/CHLAMYDIA PROBE AMP (~~LOC~~) NOT AT ARMC
CHLAMYDIA, DNA PROBE: NEGATIVE
NEISSERIA GONORRHEA: NEGATIVE

## 2014-12-11 MED ORDER — BENZTROPINE MESYLATE 0.5 MG PO TABS
0.5000 mg | ORAL_TABLET | Freq: Every day | ORAL | Status: DC
  Administered 2014-12-11 – 2014-12-14 (×2): 0.5 mg via ORAL
  Filled 2014-12-11 (×5): qty 1

## 2014-12-11 MED ORDER — RISPERIDONE 0.5 MG PO TABS
0.5000 mg | ORAL_TABLET | Freq: Every day | ORAL | Status: DC
  Administered 2014-12-11: 0.5 mg via ORAL
  Filled 2014-12-11 (×5): qty 1

## 2014-12-11 MED ORDER — FLUOXETINE HCL 20 MG PO TABS
40.0000 mg | ORAL_TABLET | Freq: Every day | ORAL | Status: DC
  Administered 2014-12-12 – 2014-12-16 (×5): 40 mg via ORAL
  Filled 2014-12-11 (×3): qty 2
  Filled 2014-12-11: qty 28
  Filled 2014-12-11 (×3): qty 2

## 2014-12-11 NOTE — [facility] Group Notes (Signed)
[facility] LCSW Group Therapy  12/11/2014 3:42 PM  Type of Therapy:  Group Therapy  Participation Level:  Active  Participation Quality:  Appropriate, Sharing and Supportive  Affect:  Appropriate  Cognitive:  Appropriate  Insight:  Engaged  Engagement in Therapy:  Engaged  Modes of Intervention:  Discussion, Exploration and Orientation  Summary of Progress/Problems:  Patient actively engaged in group session, supportive and sharing.  Identified "equality" as an example of balance, described need to accept difficult situations in life in order to move through them more effectively.  Describes his difficulties with isolation and lack of control in his life as major sources of distress.  Provided support and clarification for others in group in friendly, supportive manner.  Identifies a need to trust MDs and RNs more, being willing to consider medications and to ask questions in order to clarify his questions.    Sallee Lange 12/11/2014, 3:42 PM

## 2014-12-11 NOTE — Progress Notes (Signed)
[facility] MD Progress Note  12/11/2014 11:22 AM Ruben Marks  MRN:  [id number] Subjective: Patient states " I am still tired, depressed .'  Objective: Patient seen and chart reviewed.Discussed patient with treatment team. Patient presented after having a psychotic episode at recovery house. Pt today appears sad, anxious , seen in bed as isolative , Withdrawn. Pt denies any new concerns , states the penile rash is improving. However per staff pt has been seen as responding to internal stimuli. Will add a small dose of antipsychotic and re- evaluate . Pt denies any ADRs of medications other than feeling tired. Pt encouraged to attend groups , be compliant on medications. Pt with UDS pos- for BZD- states that he had Librium at the other hospital the first week of June and that he did not use any after that . Pt denies any withdrawal sx- other than feeling lethargic, anxious.   Principal Problem: MDD (major depressive disorder), recurrent, severe, with psychosis Diagnosis:   Patient Active Problem List   Diagnosis Date Noted  . MDD (major depressive disorder), recurrent, severe, with psychosis [F33.3] 12/10/2014  . GAD (generalized anxiety disorder) [F41.1] 12/10/2014  . Alcohol use disorder, severe, in early remission [F10.99] 12/10/2014  . Moderate benzodiazepine use disorder [F13.90] 12/10/2014   Total Time spent with patient: 30 minutes   Past Medical History:  Past Medical History  Diagnosis Date  . Hypertension   . 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 Anxiety   . Depression    History reviewed. No pertinent past surgical history. Family History:  Family History  Problem Relation Age of Onset  . Anxiety disorder Mother    Social History:  History  Alcohol Use  . Yes    Comment: just 11/20/2014     History  Drug Use  . Yes  . Special: Benzodiazepines    History   Social History  . Marital Status: Unknown    Spouse Name: N/A  . Number of Children: N/A  . Years of  Education: N/A   Social History Main Topics  . Smoking status: Former Games developer  . Smokeless tobacco: Not on file  . Alcohol Use: Yes     Comment: just 11/20/2014  . Drug Use: Yes    Special: Benzodiazepines  . Sexual Activity: No   Other Topics Concern  . None   Social History Narrative   Additional History:    Sleep: Fair  Appetite:  Fair     Musculoskeletal: Strength & Muscle Tone: within normal limits Gait & Station: normal Patient leans: N/A   Psychiatric Specialty Exam: Physical Exam  Review of Systems  Skin: Positive for rash.       Lateral aspect of penis   Psychiatric/Behavioral: Positive for depression and substance abuse. The patient is nervous/anxious.   All other systems reviewed and are negative.   Blood pressure 117/86, pulse 67, temperature 97.3 F (36.3 C), temperature source Oral, resp. rate 20, height  (1.905 m), weight 109.317 kg (241 lb), SpO2 97 %.Body mass index is 30.12 kg/(m^2).  General Appearance: Fairly Groomed  Patent attorney::  Fair  Speech:  Normal Rate  Volume:  Decreased  Mood:  Anxious and Depressed  Affect:  Restricted  Thought Process:  Linear  Orientation:  Full (Time, Place, and Person)  Thought Content:  Paranoid Ideation and seen as responding to internal stimuli  Suicidal Thoughts:  No  Homicidal Thoughts:  No  Memory:  Immediate;   Fair Recent;   Fair Remote;  Fair  Judgement:  Impaired  Insight:  Fair  Psychomotor Activity:  Normal  Concentration:  Poor  Recall:  [employer] of Knowledge:Fair  Language: Fair  Akathisia:  No  Handed:  Right  AIMS (if indicated):     Assets:  Desire for Improvement  ADL's:  Intact  Cognition: WNL  Sleep:        Current Medications: Current Facility-Administered Medications  Medication Dose Route Frequency Provider Last Rate Last Dose  . acetaminophen (TYLENOL) tablet 650 mg  650 mg Oral Q6H PRN Kerry Hough, PA-C      . alum & mag hydroxide-simeth (MAALOX/MYLANTA)  200-200-20 MG/5ML suspension 30 mL  30 mL Oral Q4H PRN Kerry Hough, PA-C      . benztropine (COGENTIN) tablet 0.5 mg  0.5 mg Oral QHS Dorothe Elmore, MD      . carvedilol (COREG) tablet 12.5 mg  12.5 mg Oral BID WC Kerry Hough, PA-C   12.5 mg at 12/11/14 0951  . chlordiazePOXIDE (LIBRIUM) capsule 25 mg  25 mg Oral QID PRN Jomarie Longs, MD      . Melene Muller ON 12/12/2014] FLUoxetine (PROZAC) tablet 40 mg  40 mg Oral Daily Odessie Polzin, MD      . hydrochlorothiazide (MICROZIDE) capsule 12.5 mg  12.5 mg Oral Daily Shavette Shoaff, MD   12.5 mg at 12/11/14 0952  . hydrOXYzine (ATARAX/VISTARIL) tablet 25 mg  25 mg Oral Q6H PRN Kerry Hough, PA-C   25 mg at 12/10/14 2112  . losartan (COZAAR) tablet 100 mg  100 mg Oral Daily Kerry Hough, PA-C   100 mg at 12/11/14 0950  . magnesium hydroxide (MILK OF MAGNESIA) suspension 30 mL  30 mL Oral Daily PRN Kerry Hough, PA-C      . risperiDONE (RISPERDAL) tablet 0.5 mg  0.5 mg Oral QHS Nazir Hacker, MD      . thiamine (VITAMIN B-1) tablet 100 mg  100 mg Oral Daily Kerry Hough, PA-C   100 mg at 12/11/14 0865    Lab Results:  Results for orders placed or performed during the hospital encounter of 12/10/14 (from the past 48 hour(s))  TSH     Status: Abnormal   Collection Time: 12/11/14  6:18 AM  Result Value Ref Range   TSH 7.749 (H) 0.350 - 4.500 uIU/mL    Comment: Performed at [facility]    Physical Findings: AIMS: Facial and Oral Movements Muscles of Facial Expression: None, normal Lips and Perioral Area: None, normal Jaw: None, normal Tongue: None, normal,Extremity Movements Upper (arms, wrists, hands, fingers): None, normal Lower (legs, knees, ankles, toes): None, normal, Trunk Movements Neck, shoulders, hips: None, normal, Overall Severity Severity of abnormal movements (highest score from questions above): None, normal Incapacitation due to abnormal movements: None, normal, Dental Status Current problems  with teeth and/or dentures?: No Does patient usually wear dentures?: No  CIWA:  CIWA-Ar Total: 1 COWS:      Assessment: Patient presented after having a psychotic episode [facility] at [facility]. Pt today continues to be depressed , anxious . Pt will benefit from treatment.    Treatment Plan Summary: Daily contact with patient to assess and evaluate symptoms and progress in treatment and Medication management Will increase Prozac to 40mg  for affective sx. Will add Risperidone 0.5 mg po qhs for psychosis. Add Cogentin 0.5 mg po qhs for EPS. CIWA/Librium prn for alcohol, BZD withdrawal sx- pt with UDS positive for BZD - reports he  did not abuse it after DC from [facility]. Will continue to monitor vitals ,medication compliance and treatment side effects while patient is here.  Will monitor for medical issues as well as call consult as needed.  Reviewed labs- TSH - elevated - will get T3,T4. Will also get EKG for qtc ( due to antipsychotic initiation), lipid panel, hba1c. Will await pending results of STDs. CSW will start working on disposition. Pt wants to return to [facility] house. Patient to participate in therapeutic milieu .   Medical Decision Making:  Review of Psycho-Social Stressors (1), Review or order clinical lab tests (1), Review of Last Therapy Session (1), Review of Medication Regimen & Side Effects (2) and Review of New Medication or Change in Dosage (2)     Yoali Conry MD 12/11/2014, 11:22 AM

## 2014-12-11 NOTE — [facility] Group Notes (Signed)
Summary of Progress/Problems: [facility] Group Notes:  (Nursing/MHT/Case Management/Adjunct)  Date:  12/11/2014  Time:  4:52 PM  Type of Therapy:  Nurse Education  Participation Level:  Did Not Attend  Participation Quality:  Did not attend  Affect:  Did not attend  Cognitive:  did not attend  Insight:  None  Engagement in Group:  None  Modes of Intervention:  did not attend   Summary of Progress/Problems: The purpose of this group is to discuss the topic of Goals. Patient was invited to group but did not attend. Writer spoke one on one with this patient whom was laying in bed shortly before group. He stated that he felt overwhelmed because he lost his sister's new number and he is no longer allowed back at [facility] house. Patient reported, "it's a mess." Patient was reassured by Clinical research associate and offered therapeutic listening. Patient wanted to stay in bed.    Marzetta Board E 12/11/2014, 4:57 PM

## 2014-12-11 NOTE — Progress Notes (Addendum)
A  Ruben Marks has spent much of his day today in his bed..in his room. He is cooperative, he is pleasant to deal with and he is appreciative of anything done for him. He completes his morning assessment and on it he writes he denies SI today and he rates his depression, hopelessness and anxiety " 0/0/0" respectively".  He says he is " upset" because he found ourt today that " I can't go back there"....meaning [facility] ( the shelter he was taying in before he came here). Support offered by this wirter. Pt sad, flat and depressed.   A He takes his meds as scheduled. He denied any withdrawal symptoms and he is due for an ekg...for baseline documentation.    R safety is in place and poc cont.

## 2014-12-11 NOTE — Tx Team (Signed)
Interdisciplinary Treatment Plan Update (Adult)  Date: 12/11/2014  Time Reviewed: 9:33 AM  Progress in Treatment:  Attending groups: . No, will be encouraged to do so Participating in groups:.No, see above  Taking medication as prescribed: Yes  Tolerating medication: Yes  Family/Significant othe contact made: CSW will make contact w sister w patient permission Patient understands diagnosis: Yes, AEB wanting to return to [facility] for continued SA treatment  Discussing patient identified problems/goals with staff: Yes Medical problems stabilized or resolved: Yes Denies suicidal/homicidal ideation:  SI voiced to MD Patient has not harmed self or Others: Yes  New problem(s) identified: None Discharge Plan or Barriers: Assess stability of patient plan to return to [facility] in [place] for residential SA treatment,  Additional comments:  Reason for Continuation of Hospitalization: depression, SI, medication stabilization  Estimated length of stay: 5 - 7 days   For review of initial/current patient goals, please see plan of care.  Attendees:  Patient:    Family:    Physician: Jomarie Longs, MD  12/11/2014 9:32 AM   Nursing:  Harrell Lark, RN,Christa RN    12/11/2014 9:32 AM     Clinical Social Worker Santa Genera, LCSW    12/11/2014 9:32 AM     Other:    Other:    Other:    Other:    Scribe for Treatment Team:  Santa Genera, LCSW Clinical Social Worker  12/11/2014 9:31 AM

## 2014-12-11 NOTE — Progress Notes (Signed)
D: Pt in hallway upon assessment.  Pt has anxious affect and mood.  Pt reports he is "not too good.  I was at [facility] and today I got word from them that I can't go back there."  Pt reports the "social worker is working on getting me to another place, I'm just frustrated."  Pt also reports concern because he can't remember his sister's phone number and all of his belongings are at [employer] and pt believes his sister is the only person who will bring him to get his belongings.  Later in shift, pt reported he was able to get in contact with his ex-girlfriend.  Pt reported his ex-girlfriend was trying to get in touch with his sister and that his ex-girlfriend may take him to get his belongings if needed.  Pt reported this made him feel much better.  Pt denies SI/HI, denies hallucinations, denies pain, denies withdrawal symptoms.  Pt has been visible in milieu with minimal peer interaction.  Pt did not attend evening group.  A:  Met with pt 1:1 and provided support and encouragement.  Actively listened to pt.  Medications administered per order.  PRN medication administered for anxiety.  Lab called regarding collection of Herpes simplex virus(hsv) dna by pcr, lab told writer that lab could be collected by phlebotomist in [place] in the morning.   R: Pt is compliant with medications.  Pt verbally contracts for safety and reports that he will notify staff of needs and concerns.  Will continue to monitor and assess.

## 2014-12-12 DIAGNOSIS — E785 Hyperlipidemia, unspecified: Secondary | ICD-10-CM | POA: Clinically undetermined

## 2014-12-12 DIAGNOSIS — R7989 Other specified abnormal findings of blood chemistry: Secondary | ICD-10-CM | POA: Clinically undetermined

## 2014-12-12 LAB — LIPID PANEL
Cholesterol: 262 mg/dL — ABNORMAL HIGH (ref 0–200)
HDL: 42 mg/dL (ref >40)
LDL CALC: 173 mg/dL — AB (ref 0–99)
TRIGLYCERIDES: 237 mg/dL — AB (ref <150)
Total CHOL/HDL Ratio: 6.2 RATIO
VLDL: 47 mg/dL — ABNORMAL HIGH (ref 0–40)

## 2014-12-12 MED ORDER — SIMVASTATIN 20 MG PO TABS
20.0000 mg | ORAL_TABLET | Freq: Every day | ORAL | Status: DC
  Administered 2014-12-12 – 2014-12-15 (×4): 20 mg via ORAL
  Filled 2014-12-12: qty 1
  Filled 2014-12-12: qty 14
  Filled 2014-12-12 (×4): qty 1

## 2014-12-12 NOTE — [facility] Group Notes (Signed)
[facility] LCSW Group Therapy  12/12/2014 1:15 PM  Type of Therapy:  Group Therapy  Participation Level:  Active  Participation Quality:  Appropriate  Affect:  Appropriate  Cognitive:  Appropriate  Insight:  Developing/Improving  Engagement in Therapy:  Engaged  Modes of Intervention:  Discussion, Exploration, Socialization and Support  Summary of Progress/Problems: Group session facilitated by [facility] Chaplain on subject of courage and what that meant to patients and their experiences with courage. Ruben Marks came into group after start but engaged quickly and easily. He shared that courage means to him: having integrity, truthfulness, honesty and a clear conscious.  Patient also shared that courage is showing up for him as "having a willingness to start over in a whole new place."    Harrill, Ruben Marks

## 2014-12-12 NOTE — Progress Notes (Signed)
[facility] MD Progress Note  12/12/2014 11:55 AM Ruben Marks  MRN:  [id number] Subjective: Patient states " I am still tired."   Objective: Patient seen and chart reviewed.Discussed patient with treatment team. Patient presented after having a psychotic episode at recovery house.  Pt continues to appear depressed , withdrawn , endorses feeling tired . Pt per staff also endorsed SI , with no plan. Pt frustrated with the news that he cannot return to [facility] house . Pt is willing to get help with his substance abuse - is open to other possibilities. Pt also focused on his penile rash - states it is getting better- denies pain. Pt had HIV,GC/CHl,RPR done - which came back negative. Pt with elevated TSH - ordered T3, T4 - awaiting results - this could be contributing to his feeling of tiredness. Pt superficially cooperative , but continues to be guarded mostly - unknown if this is secondary to paranoid thoughts - he however denies it . Will continue to observe.   Pt with UDS pos- for BZD- states that he had Librium at the other hospital the first week of June and that he did not use any after that . Pt denies any withdrawal sx- other than feeling lethargic, anxious.   Principal Problem: MDD (major depressive disorder), recurrent, severe, with psychosis Diagnosis:   Patient Active Problem List   Diagnosis Date Noted  . Elevated TSH [R94.6] 12/12/2014  . Hyperlipidemia [E78.5] 12/12/2014  . MDD (major depressive disorder), recurrent, severe, with psychosis [F33.3] 12/10/2014  . GAD (generalized anxiety disorder) [F41.1] 12/10/2014  . Alcohol use disorder, severe, in early remission [F10.99] 12/10/2014  . Moderate benzodiazepine use disorder [F13.90] 12/10/2014   Total Time spent with patient: 30 minutes   Past Medical History:  Past Medical History  Diagnosis Date  . Hypertension   . 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 Anxiety   . Depression    History reviewed. No pertinent past  surgical history. Family History:  Family History  Problem Relation Age of Onset  . Anxiety disorder Mother    Social History:  History  Alcohol Use  . Yes    Comment: just 11/20/2014     History  Drug Use  . Yes  . Special: Benzodiazepines    History   Social History  . Marital Status: Unknown    Spouse Name: N/A  . Number of Children: N/A  . Years of Education: N/A   Social History Main Topics  . Smoking status: Former Games developer  . Smokeless tobacco: Not on file  . Alcohol Use: Yes     Comment: just 11/20/2014  . Drug Use: Yes    Special: Benzodiazepines  . Sexual Activity: No   Other Topics Concern  . None   Social History Narrative   Additional History:    Sleep: Fair  Appetite:  Fair     Musculoskeletal: Strength & Muscle Tone: within normal limits Gait & Station: normal Patient leans: N/A   Psychiatric Specialty Exam: Physical Exam  Review of Systems  Skin: Positive for rash.       Lateral aspect of penis   Psychiatric/Behavioral: Positive for depression and substance abuse. The patient is nervous/anxious.   All other systems reviewed and are negative.   Blood pressure 112/88, pulse 72, temperature 97.7 F (36.5 C), temperature source Oral, resp. rate 18, height 6\' 3"  (1.905 m), weight 109.317 kg (241 lb), SpO2 97 %.Body mass index is 30.12 kg/(m^2).  General Appearance: Fairly Groomed  Eye  Contact::  Fair  Speech:  Normal Rate  Volume:  Decreased  Mood:  Anxious and Depressed  Affect:  Restricted  Thought Process:  Linear  Orientation:  Full (Time, Place, and Person)  Thought Content:  Paranoid Ideation and Rumination  Suicidal Thoughts:  Yes.  without intent/plan  Homicidal Thoughts:  No  Memory:  Immediate;   Fair Recent;   Fair Remote;   Fair  Judgement:  Impaired  Insight:  Fair  Psychomotor Activity:  Normal  Concentration:  Poor  Recall:  [employer] of Knowledge:Fair  Language: Fair  Akathisia:  No  Handed:  Right  AIMS (if  indicated):     Assets:  Desire for Improvement  ADL's:  Intact  Cognition: WNL  Sleep:        Current Medications: Current Facility-Administered Medications  Medication Dose Route Frequency Provider Last Rate Last Dose  . acetaminophen (TYLENOL) tablet 650 mg  650 mg Oral Q6H PRN Kerry Hough, PA-C      . alum & mag hydroxide-simeth (MAALOX/MYLANTA) 200-200-20 MG/5ML suspension 30 mL  30 mL Oral Q4H PRN Kerry Hough, PA-C      . benztropine (COGENTIN) tablet 0.5 mg  0.5 mg Oral QHS Jomarie Longs, MD   0.5 mg at 12/11/14 2114  . carvedilol (COREG) tablet 12.5 mg  12.5 mg Oral BID WC Kerry Hough, PA-C   12.5 mg at 12/12/14 0926  . chlordiazePOXIDE (LIBRIUM) capsule 25 mg  25 mg Oral QID PRN Jomarie Longs, MD      . FLUoxetine (PROZAC) tablet 40 mg  40 mg Oral Daily Denora Wysocki, MD   40 mg at 12/12/14 0925  . hydrochlorothiazide (MICROZIDE) capsule 12.5 mg  12.5 mg Oral Daily Beaux Verne, MD   12.5 mg at 12/12/14 0926  . hydrOXYzine (ATARAX/VISTARIL) tablet 25 mg  25 mg Oral Q6H PRN Kerry Hough, PA-C   25 mg at 12/11/14 2009  . losartan (COZAAR) tablet 100 mg  100 mg Oral Daily Kerry Hough, PA-C   100 mg at 12/12/14 4098  . magnesium hydroxide (MILK OF MAGNESIA) suspension 30 mL  30 mL Oral Daily PRN Kerry Hough, PA-C      . risperiDONE (RISPERDAL) tablet 0.5 mg  0.5 mg Oral QHS Jomarie Longs, MD   0.5 mg at 12/11/14 2114  . simvastatin (ZOCOR) tablet 20 mg  20 mg Oral q1800 Chery Giusto, MD      . thiamine (VITAMIN B-1) tablet 100 mg  100 mg Oral Daily Kerry Hough, PA-C   100 mg at 12/12/14 1191    Lab Results:  Results for orders placed or performed during the hospital encounter of 12/10/14 (from the past 48 hour(s))  RPR     Status: None   Collection Time: 12/11/14  6:18 AM  Result Value Ref Range   RPR Ser Ql Non Reactive Non Reactive    Comment: (NOTE) Performed At: [facility] [address] [place], [place] [id number] Mila Homer MD [id number] Performed at [facility]   HIV antibody     Status: None   Collection Time: 12/11/14  6:18 AM  Result Value Ref Range   HIV Screen 4th Generation wRfx Non Reactive Non Reactive    Comment: (NOTE) Performed At: [facility] [address] [place], [place] [id number] Mila Homer MD [id number] Performed at [facility]   TSH     Status: Abnormal   Collection Time:  12/11/14  6:18 AM  Result Value Ref Range   TSH 7.749 (H) 0.350 - 4.500 uIU/mL    Comment: Performed at [facility]  Lipid panel     Status: Abnormal   Collection Time: 12/12/14  6:32 AM  Result Value Ref Range   Cholesterol 262 (H) 0 - 200 mg/dL   Triglycerides 161 (H) <150 mg/dL   HDL 42 >09 mg/dL   Total CHOL/HDL Ratio 6.2 RATIO   VLDL 47 (H) 0 - 40 mg/dL   LDL Cholesterol 604 (H) 0 - 99 mg/dL    Comment:        Total Cholesterol/HDL:CHD Risk Coronary Heart Disease Risk Table                     Men   Women  1/2 Average Risk   3.4   3.3  Average Risk       5.0   4.4  2 X Average Risk   9.6   7.1  3 X Average Risk  23.4   11.0        Use the calculated Patient Ratio above and the CHD Risk Table to determine the patient's CHD Risk.        ATP III CLASSIFICATION (LDL):  <100     mg/dL   Optimal  540-981  mg/dL   Near or Above                    Optimal  130-159  mg/dL   Borderline  191-478  mg/dL   High  >295     mg/dL   Very High Performed at [facility]     Physical Findings: AIMS: Facial and Oral Movements Muscles of Facial Expression: None, normal Lips and Perioral Area: None, normal Jaw: None, normal Tongue: None, normal,Extremity Movements Upper (arms, wrists, hands, fingers): None, normal Lower (legs, knees, ankles, toes): None, normal, Trunk Movements Neck, shoulders, hips: None, normal, Overall Severity Severity of abnormal movements (highest score from questions above): None,  normal Incapacitation due to abnormal movements: None, normal, Dental Status Current problems with teeth and/or dentures?: No Does patient usually wear dentures?: No  CIWA:  CIWA-Ar Total: 2 COWS:      Assessment: Patient presented after having a psychotic episode , at [facility]. Pt today continues to be depressed , anxious .Pt also with eleavted TSH - awaiting follow up labs - this may need to be addressed , which could be another contributing factor to his current sx.     Treatment Plan Summary: Daily contact with patient to assess and evaluate symptoms and progress in treatment and Medication management Increased Prozac to 40 mg po daily for affective sx. Will continue Risperidone 0.5 mg po qhs for psychosis. AIMS - 0 ( 12/12/14) Continue Cogentin 0.5 mg po qhs for EPS. CIWA/Librium prn for alcohol, BZD withdrawal sx- pt with UDS positive for BZD - reports he did not abuse it after DC from [facility], which was >10 days ago. Will continue to monitor vitals ,medication compliance and treatment side effects while patient is here.  Will monitor for medical issues as well as call consult as needed.  Reviewed labs- TSH - elevated - will get T3,T4. Hospitalist consult over the weekend - if labs abnormal. HIV,RPR,GC/Chl - negative Lipid panel - abnormal - start Zocor 20 mg po qpm. CSW will start working on disposition. Pt to be referred to substance abuse program due to severe hx of  alcohol, BZD abuse. Patient to participate in therapeutic milieu .   Medical Decision Making:  Review of Psycho-Social Stressors (1), Review or order clinical lab tests (1), Review of Last Therapy Session (1), Review of Medication Regimen & Side Effects (2) and Review of New Medication or Change in Dosage (2)     Nickolus Wadding MD 12/12/2014, 11:55 AM

## 2014-12-12 NOTE — Plan of Care (Signed)
Problem: Ineffective individual coping Goal: STG: Patient will remain free from self harm Outcome: Progressing Pt has not harmed himself tonight.  He denies SI/thoughts of self-harm and verbally contracts for safety.

## 2014-12-12 NOTE — Progress Notes (Signed)
Ruben Marks is seen [facility] on the 500 hall today...tolerated well. He is still worried about his belongings at [facility] but he is able to speak to his family about helping him get them.   A He takes his meds as planned. He completes his daily assessment and on it he writes he denies  SI and he rates his depression, hopelessness and anxiety " 2/0/1" respectively.    R He is scheduled for dietary consult for hyperlipidemia and started on zocor 20 mg po qd.

## 2014-12-12 NOTE — Clinical Social Work Note (Signed)
Call from [facility] - patient needs to call facility to set up phone screen [phone].  Facility does have male beds, has $750 admission fee but is able to discuss options re fees.  Santa Genera, LCSW Clinical Social Worker

## 2014-12-12 NOTE — Progress Notes (Addendum)
D: Pt presents appropriate in affect and anxious in mood. Pt is currently negative for any SI/HI/AVH. Pt is visible and active within the milieu. Pt refused his Risperdal and cogentin. Per pt, he feels well managed with the use of Prozac and Vistaril. Pt reports that he is no longer experiencing psychosis. Pt also verbalizes that prior to admission was his first and only episode of psychosis. Pt was encouraged to speak with the psychiatrist in regards to continued treatment with Risperdal.    A: Writer administered scheduled and prn medications to pt, per MD orders. Continued support and availability as needed was extended to this pt. Staff continue to monitor pt with q52min checks.  R: No adverse drug reactions noted. Pt receptive to treatment. Pt remains safe at this time.

## 2014-12-12 NOTE — [facility] Counselor (Signed)
Patient was given both BATTS and [employer]OSA applications to fill out at 11:30 AM today Carney Bern, LCSW

## 2014-12-12 NOTE — Plan of Care (Signed)
Problem: Ineffective individual coping Goal: STG-Increase in ability to manage activities of daily living Outcome: Progressing Able to perform ADL's adequeately

## 2014-12-12 NOTE — [facility] Group Notes (Signed)
[facility] LCSW Aftercare Discharge Planning Group Note   12/12/2014 9:30 AM  Participation Quality:  Appropriate  Mood/Affect:  Depressed  Thoughts of Suicide:  No Will you contract for safety?   NA  Current AVH:  Negative  Plan for Discharge/Comments:  Disappointed he cannot return to [facility]; Would like application for [employer]: Sister or Friend; depends on date and time  Supports: Sister and friend  Dyane Dustman, Julious Payer

## 2014-12-13 DIAGNOSIS — F333 Major depressive disorder, recurrent, severe with psychotic symptoms: Principal | ICD-10-CM

## 2014-12-13 DIAGNOSIS — R45851 Suicidal ideations: Secondary | ICD-10-CM

## 2014-12-13 LAB — HEMOGLOBIN A1C
Hgb A1c MFr Bld: 5.7 % — ABNORMAL HIGH (ref 4.8–5.6)
Mean Plasma Glucose: 117 mg/dL

## 2014-12-13 LAB — HERPES SIMPLEX VIRUS(HSV) DNA BY PCR
HSV 1 DNA: NEGATIVE
HSV 2 DNA: NEGATIVE

## 2014-12-13 NOTE — [facility] Group Notes (Signed)
[facility] Group Notes:  (Nursing/MHT/Case Management/Adjunct)  Date:  12/13/2014  Time:  1:55 PM  Type of Therapy:  Psychoeducational Skills  Participation Level:  Did Not Attend  Participation Quality:  Did Not Attend  Affect:  Did Not Attend  Cognitive:  Did Not Attend  Insight:  None  Engagement in Group:  Did Not Attend  Modes of Intervention:  Did Not Attend  Summary of Progress/Problems: Pt did not attend patient self inventory group.   Jacquelyne Balint Shanta 12/13/2014, 1:55 PM

## 2014-12-13 NOTE — [facility] Group Notes (Signed)
[facility] Group Notes: (Clinical Social Work)   12/13/2014      Type of Therapy:  Group Therapy   Participation Level:  Did Not Attend despite MHT prompting   Ambrose Mantle, LCSW 12/13/2014, 12:49 PM

## 2014-12-13 NOTE — Progress Notes (Signed)
Patient ID: Ruben Marks, male   DOB: Oct 27, 1965, 49 y.o.   MRN: [id number]  D: Patient pleasant on approach tonight. When asking about an EKG, he reports he had it done this morning. Looked in chart and he had one done. Patient reports feeling well. Denies any issues with mood or hearing voices. He refused Risperdal and cogentin. Doesn't feel he needs to be on that medication. Talked about benefits but still did not want. No psychosis noted. Did get vistaril prn to help him rest. A: Staff will monitor on q 15 minute checks, follow treatment plan, and give meds as ordered R: Cooperative on the unit.

## 2014-12-13 NOTE — Progress Notes (Signed)
Ruben Marks has been seen out of his room much more today. Albeit he is quiet, he has spent much more time in the dayroom...just hanging around the other patients...watching TV , going to his groups. HE has talked more today to this writer than he has for the past 3 days. HE was assisted to call [employer] ( where his belongings are) and he was able to get info on how he needs to go about repossessing them( he says he spoke to somebody there that he knows and he is supposed to call back tomorrow).   AHe did not complete and return his daily assessment but he denies  active SI and he contracts for safety with this nurse. He requested and was given 2 tylenol for c/o back pain that he rated " 4 out of 10" and he stated he received " relief 1 hr later".   R Safety is in place and poc cont.

## 2014-12-13 NOTE — Progress Notes (Signed)
Patient ID: Ruben Marks, male   DOB: 05/14/1966, 49 y.o.   MRN: [id number] [facility] MD Progress Note  12/13/2014 12:05 PM Ruben Marks  MRN:  [id number] Subjective: Patient states " I am somewhat tired." says less depressed   Objective: Patient seen and chart reviewed.Discussed patient with treatment team. Patient presented after having a psychotic episode at recovery house.  Pt continues to appear depressed , withdrawn , endorses feeling tired . Pt per staff also endorsed SI , with no plan. Pt frustrated with the news that he cannot return to [facility] house . Pt is willing to get help with his substance abuse - is open to other possibilities. Pt also focused on his penile rash - states it is getting better- denies pain. Pt had HIV,GC/CHl,RPR done - which came back negative. Pt with elevated TSH - ordered T3, T4 - awaiting results - this could be contributing to his feeling of tiredness. Pt superficially cooperative , but continues to be guarded mostly - unknown if this is secondary to paranoid thoughts - he however denies it . Offers limited and small answers Will continue to observe.   Pt with UDS pos- for BZD- states that he had Librium at the other hospital the first week of June and that he did not use any after that . Pt denies any withdrawal sx- other than feeling lethargic, anxious.   Principal Problem: MDD (major depressive disorder), recurrent, severe, with psychosis Diagnosis:   Patient Active Problem List   Diagnosis Date Noted  . Elevated TSH [R94.6] 12/12/2014  . Hyperlipidemia [E78.5] 12/12/2014  . MDD (major depressive disorder), recurrent, severe, with psychosis [F33.3] 12/10/2014  . GAD (generalized anxiety disorder) [F41.1] 12/10/2014  . Alcohol use disorder, severe, in early remission [F10.99] 12/10/2014  . 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 Hypertension   . 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 Anxiety   . Depression    History reviewed. No pertinent past surgical history. Family History:  Family History  Problem Relation Age of Onset  . Anxiety disorder Mother    Social History:  History  Alcohol Use  . Yes    Comment: just 11/20/2014     History  Drug Use  . Yes  . Special: Benzodiazepines    History   Social History  . Marital Status: Unknown    Spouse Name: N/A  . Number of Children: N/A  . Years of Education: N/A   Social History Main Topics  . Smoking status: Former Games developer  . Smokeless tobacco: Not on file  . Alcohol Use: Yes     Comment: just 11/20/2014  . Drug Use: Yes    Special: Benzodiazepines  . Sexual Activity: No   Other Topics Concern  . None   Social History Narrative   Additional History:    Sleep: Fair  Appetite:  Fair     Musculoskeletal: Strength & Muscle Tone: within normal limits Gait & Station: normal Patient leans: N/A   Psychiatric Specialty Exam: Physical Exam  Review of Systems  Constitutional: Negative for fever.  Skin:       Lateral aspect of penis   Neurological: Negative for headaches.  Psychiatric/Behavioral: Positive for depression and substance abuse. The patient is nervous/anxious.   All other systems reviewed and are negative.   Blood pressure 120/67, pulse 78, temperature 98.1 F (36.7 C), temperature  source Oral, resp. rate 18, height 6\' 3"  (1.905 m), weight 109.317 kg (241 lb), SpO2 97 %.Body mass index is 30.12 kg/(m^2).  General Appearance: Fairly Groomed  Patent attorney::  Fair  Speech:  Normal Rate  Volume:  Decreased  Mood:  Anxious and Depressed  Affect:  Restricted  Thought Process:  Linear  Orientation:  Full (Time, Place, and Person)  Thought Content:  Paranoid Ideation and Rumination  Suicidal Thoughts:  Yes.  without intent/plan  Homicidal Thoughts:  No  Memory:  Immediate;   Fair Recent;   Fair Remote;   Fair   Judgement:  Impaired  Insight:  Fair  Psychomotor Activity:  Normal  Concentration:  Poor  Recall:  [employer] of Knowledge:Fair  Language: Fair  Akathisia:  No  Handed:  Right  AIMS (if indicated):     Assets:  Desire for Improvement  ADL's:  Intact  Cognition: WNL  Sleep:        Current Medications: Current Facility-Administered Medications  Medication Dose Route Frequency Provider Last Rate Last Dose  . acetaminophen (TYLENOL) tablet 650 mg  650 mg Oral Q6H PRN Kerry Hough, PA-C      . alum & mag hydroxide-simeth (MAALOX/MYLANTA) 200-200-20 MG/5ML suspension 30 mL  30 mL Oral Q4H PRN Kerry Hough, PA-C      . benztropine (COGENTIN) tablet 0.5 mg  0.5 mg Oral QHS Jomarie Longs, MD   0.5 mg at 12/11/14 2114  . carvedilol (COREG) tablet 12.5 mg  12.5 mg Oral BID WC Kerry Hough, PA-C   12.5 mg at 12/13/14 4680  . chlordiazePOXIDE (LIBRIUM) capsule 25 mg  25 mg Oral QID PRN Jomarie Longs, MD      . FLUoxetine (PROZAC) tablet 40 mg  40 mg Oral Daily Saramma Eappen, MD   40 mg at 12/13/14 0823  . hydrochlorothiazide (MICROZIDE) capsule 12.5 mg  12.5 mg Oral Daily Saramma Eappen, MD   12.5 mg at 12/13/14 3212  . hydrOXYzine (ATARAX/VISTARIL) tablet 25 mg  25 mg Oral Q6H PRN Kerry Hough, PA-C   25 mg at 12/12/14 2116  . losartan (COZAAR) tablet 100 mg  100 mg Oral Daily Kerry Hough, PA-C   100 mg at 12/13/14 2482  . magnesium hydroxide (MILK OF MAGNESIA) suspension 30 mL  30 mL Oral Daily PRN Kerry Hough, PA-C      . risperiDONE (RISPERDAL) tablet 0.5 mg  0.5 mg Oral QHS Jomarie Longs, MD   0.5 mg at 12/11/14 2114  . simvastatin (ZOCOR) tablet 20 mg  20 mg Oral q1800 Jomarie Longs, MD   20 mg at 12/12/14 1839  . thiamine (VITAMIN B-1) tablet 100 mg  100 mg Oral Daily Kerry Hough, PA-C   100 mg at 12/13/14 5003    Lab Results:  Results for orders placed or performed during the hospital encounter of 12/10/14 (from the past 48 hour(s))  Lipid panel      Status: Abnormal   Collection Time: 12/12/14  6:32 AM  Result Value Ref Range   Cholesterol 262 (H) 0 - 200 mg/dL   Triglycerides 704 (H) <150 mg/dL   HDL 42 >88 mg/dL   Total CHOL/HDL Ratio 6.2 RATIO   VLDL 47 (H) 0 - 40 mg/dL   LDL Cholesterol 891 (H) 0 - 99 mg/dL    Comment:        Total Cholesterol/HDL:CHD Risk Coronary Heart Disease Risk Table  Men   Women  1/2 Average Risk   3.4   3.3  Average Risk       5.0   4.4  2 X Average Risk   9.6   7.1  3 X Average Risk  23.4   11.0        Use the calculated Patient Ratio above and the CHD Risk Table to determine the patient's CHD Risk.        ATP III CLASSIFICATION (LDL):  <100     mg/dL   Optimal  409-811  mg/dL   Near or Above                    Optimal  130-159  mg/dL   Borderline  914-782  mg/dL   High  >956     mg/dL   Very High Performed at [facility]   Hemoglobin A1c     Status: Abnormal   Collection Time: 12/12/14  6:32 AM  Result Value Ref Range   Hgb A1c MFr Bld 5.7 (H) 4.8 - 5.6 %    Comment: (NOTE)         Pre-diabetes: 5.7 - 6.4         Diabetes: >6.4         Glycemic control for adults with diabetes: <7.0    Mean Plasma Glucose 117 mg/dL    Comment: (NOTE) Performed At: [facility] [address] [place], [place] [id number] Mila Homer MD IO:[id number] Performed at [facility]     Physical Findings: AIMS: Facial and Oral Movements Muscles of Facial Expression: None, normal Lips and Perioral Area: None, normal Jaw: None, normal Tongue: None, normal,Extremity Movements Upper (arms, wrists, hands, fingers): None, normal Lower (legs, knees, ankles, toes): None, normal, Trunk Movements Neck, shoulders, hips: None, normal, Overall Severity Severity of abnormal movements (highest score from questions above): None, normal Incapacitation due to abnormal movements: None, normal, Dental Status Current problems with teeth and/or dentures?:  No Does patient usually wear dentures?: No  CIWA:  CIWA-Ar Total: 2 COWS:      Assessment: Patient presented after having a psychotic episode , at [facility]. Pt today continues to be depressed , anxious .Pt also with eleavted TSH - awaiting follow up labs - this may need to be addressed , which could be another contributing factor to his current sx.     Treatment Plan Summary: Daily contact with patient to assess and evaluate symptoms and progress in treatment and Medication management Continue Prozac to 40 mg po daily for affective sx. Will continue Risperidone 0.5 mg po qhs for psychosis. AIMS - 0 ( 12/12/14) Continue Cogentin 0.5 mg po qhs for EPS. CIWA/Librium prn for alcohol, BZD withdrawal sx- pt with UDS positive for BZD - reports he did not abuse it after [facility], [facility]. Will continue to monitor vitals ,medication compliance and treatment side effects while patient is here.  Will monitor for medical issues as well as call consult as needed.  Reviewed labs- TSH - elevated - will get T3,T4. Hospitalist consult if needed. Lipid panel - abnormal - start Zocor 20 mg po qpm. CSW will start working on disposition. Pt to be referred to substance abuse program due to severe hx of alcohol, BZD abuse. Patient to participate in therapeutic milieu .   Medical Decision Making:  Review of Psycho-Social Stressors (1), Review or order clinical lab tests (1), Review of Last Therapy Session (  1), Review of Medication Regimen & Side Effects (2) and Review of New Medication or Change in Dosage (2)     Ruben Masoud MD 12/13/2014, 12:05 PM

## 2014-12-14 NOTE — [facility] Group Notes (Signed)
[facility] Group Notes:  (Nursing/MHT/Case Management/Adjunct)  Date:  12/14/2014  Time:  12:25 PM  Type of Therapy:  Nurse Education  Participation Level: Pt did not attend.   Participation Quality:  N/a   Affect:  n/a  Cognitive:  n/a  Insight:  n/a  Engagement in Group:  n/a  Modes of Intervention: n/a   Summary of Progress/Problems:n/a  Rich Brave 12/14/2014, 12:25 PM

## 2014-12-14 NOTE — Progress Notes (Addendum)
D: Patient denies pain, SI/HI and VAH. Patient requested vistaril at bedtime. PRN was given with effective results. Patient refused Risperdal at bedtime. Patient stated he did not need.  Patient seems a little more brighter, but still anxious.   A: Staff to monitor Q 15 mins for safety. Encouragement and support offered. Scheduled medications administered per orders. R: Patient remains safe on the unit. Patient attended group tonight. Patient visible on the unit and interacting with peers. Patient taking administered medications.

## 2014-12-14 NOTE — [facility] Group Notes (Signed)
[facility] Group Notes:  (Clinical Social Work)  12/14/2014  [facility] Group Notes:  (Clinical Social Work)  12/14/2014  11:00AM-12:00PM  Summary of Progress/Problems:  The main focus of today's process group was to listen to a variety of genres of music and to identify that different types of music provoke different responses.  The patient then was able to identify personally what was soothing for them, as well as energizing.  Handouts were used to record feelings evoked, as well as how patient can personally use this knowledge in sleep habits, with depression, and with other symptoms.  The patient did not arrive until almost the end of group and did not react or talk about his feelings with any of the music.  Type of Therapy:  Music Therapy   Participation Level:  Minimal  Participation Quality:  Attentive  Affect:  Blunted and Depressed  Cognitive:  N/A  Insight:  Limited  Engagement in Therapy:  Limited  Modes of Intervention:   Activity, Exploration  Ambrose Mantle, LCSW 12/14/2014

## 2014-12-14 NOTE — Progress Notes (Signed)
Patient ID: Ruben Marks, male   DOB: [birth year], 49 y.o.   MRN: [id number] [facility] MD Progress Note  12/14/2014 11:13 AM Ruben Marks  MRN:  [id number] Subjective: Patient states " doing better, still endorses feeling tired or if not enough sleep"   Objective: Patient seen and chart reviewed.Discussed patient with treatment team. Patient presented after having a psychotic episode at recovery house.  Pt appears less depressed , but still withdrawn , endorses feeling tired . Pt per staff also endorsed SI , with no plan. Pt frustrated with the news that he cannot return to [facility] house . Pt is willing to get help with his substance abuse - is open to other possibilities. Pt penile rash - states it is getting better- denies pain. Pt had HIV,GC/CHl,RPR done - which came back negative. Pt with elevated TSH - ordered T3, T4 - awaiting results - this could be contributing to his feeling of tiredness. Pt superficially cooperative , but continues to be guarded mostly - unknown if this is secondary to paranoid thoughts - he however denies it . Offers limited and small answers Will continue to observe.   Pt with UDS pos- for BZD- states that he had Librium at the other hospital the first week of June and that he did not use any after that . Pt denies any withdrawal sx- other than feeling lethargic, anxious.   Principal Problem: MDD (major depressive disorder), recurrent, severe, with psychosis Diagnosis:   Patient Active Problem List   Diagnosis Date Noted  . Elevated TSH [R94.6] 12/12/2014  . Hyperlipidemia [E78.5] 12/12/2014  . MDD (major depressive disorder), recurrent, severe, with psychosis [F33.3] 12/10/2014  . GAD (generalized anxiety disorder) [F41.1] 12/10/2014  . Alcohol use disorder, severe, in early remission [F10.99] 12/10/2014  . Moderate benzodiazepine use disorder [F13.90] 12/10/2014   Total Time spent with patient: 30 minutes   Past Medical History:  Past Medical History   Diagnosis Date  . Hypertension   . 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 Anxiety   . Depression    History reviewed. No pertinent past surgical history. Family History:  Family History  Problem Relation Age of Onset  . Anxiety disorder Mother    Social History:  History  Alcohol Use  . Yes    Comment: just 11/20/2014     History  Drug Use  . Yes  . Special: Benzodiazepines    History   Social History  . Marital Status: Unknown    Spouse Name: N/A  . Number of Children: N/A  . Years of Education: N/A   Social History Main Topics  . Smoking status: Former Games developer  . Smokeless tobacco: Not on file  . Alcohol Use: Yes     Comment: just 11/20/2014  . Drug Use: Yes    Special: Benzodiazepines  . Sexual Activity: No   Other Topics Concern  . None   Social History Narrative   Additional History:    Sleep: Fair  Appetite:  Fair     Musculoskeletal: Strength & Muscle Tone: within normal limits Gait & Station: normal Patient leans: N/A   Psychiatric Specialty Exam: Physical Exam  Review of Systems  Constitutional: Negative for fever.  Cardiovascular: Negative for chest pain.  Skin:       Lateral aspect of penis   Neurological: Positive for weakness. Negative for headaches.  Psychiatric/Behavioral: Positive for depression and substance abuse.  All other systems reviewed and are negative.   Blood pressure 119/88, pulse 65, temperature  98.1 F (36.7 C), temperature source Oral, resp. rate 20, height 6\' 3"  (1.905 m), weight 109.317 kg (241 lb), SpO2 97 %.Body mass index is 30.12 kg/(m^2).  General Appearance: Fairly Groomed  Patent attorney::  Fair  Speech:  Normal Rate  Volume:  Decreased  Mood:  Less depressed but still feels tired  Affect:  Restricted  Thought Process:  Linear  Orientation:  Full (Time, Place, and Person)  Thought Content:  Paranoid Ideation and Rumination  Suicidal Thoughts:  Yes.  without intent/plan  Homicidal Thoughts:  No  Memory:   Immediate;   Fair Recent;   Fair Remote;   Fair  Judgement:  Impaired  Insight:  Fair  Psychomotor Activity:  Normal  Concentration:  Poor  Recall:  [employer] of Knowledge:Fair  Language: Fair  Akathisia:  No  Handed:  Right  AIMS (if indicated):     Assets:  Desire for Improvement  ADL's:  Intact  Cognition: WNL  Sleep:  Number of Hours: 6.75     Current Medications: Current Facility-Administered Medications  Medication Dose Route Frequency Provider Last Rate Last Dose  . acetaminophen (TYLENOL) tablet 650 mg  650 mg Oral Q6H PRN Kerry Hough, PA-C   650 mg at 12/13/14 1425  . alum & mag hydroxide-simeth (MAALOX/MYLANTA) 200-200-20 MG/5ML suspension 30 mL  30 mL Oral Q4H PRN Kerry Hough, PA-C      . benztropine (COGENTIN) tablet 0.5 mg  0.5 mg Oral QHS Jomarie Longs, MD   0.5 mg at 12/11/14 2114  . carvedilol (COREG) tablet 12.5 mg  12.5 mg Oral BID WC Kerry Hough, PA-C   12.5 mg at 12/14/14 1610  . chlordiazePOXIDE (LIBRIUM) capsule 25 mg  25 mg Oral QID PRN Jomarie Longs, MD      . FLUoxetine (PROZAC) tablet 40 mg  40 mg Oral Daily Saramma Eappen, MD   40 mg at 12/14/14 0918  . hydrochlorothiazide (MICROZIDE) capsule 12.5 mg  12.5 mg Oral Daily Saramma Eappen, MD   12.5 mg at 12/14/14 0918  . hydrOXYzine (ATARAX/VISTARIL) tablet 25 mg  25 mg Oral Q6H PRN Kerry Hough, PA-C   25 mg at 12/13/14 2113  . losartan (COZAAR) tablet 100 mg  100 mg Oral Daily Kerry Hough, PA-C   100 mg at 12/14/14 9604  . magnesium hydroxide (MILK OF MAGNESIA) suspension 30 mL  30 mL Oral Daily PRN Kerry Hough, PA-C      . risperiDONE (RISPERDAL) tablet 0.5 mg  0.5 mg Oral QHS Jomarie Longs, MD   0.5 mg at 12/11/14 2114  . simvastatin (ZOCOR) tablet 20 mg  20 mg Oral q1800 Jomarie Longs, MD   20 mg at 12/13/14 1619  . thiamine (VITAMIN B-1) tablet 100 mg  100 mg Oral Daily Kerry Hough, PA-C   100 mg at 12/14/14 5409    Lab Results:  No results found for this or any  previous visit (from the past 48 hour(s)).  Physical Findings: AIMS: Facial and Oral Movements Muscles of Facial Expression: None, normal Lips and Perioral Area: None, normal Jaw: None, normal Tongue: None, normal,Extremity Movements Upper (arms, wrists, hands, fingers): None, normal Lower (legs, knees, ankles, toes): None, normal, Trunk Movements Neck, shoulders, hips: None, normal, Overall Severity Severity of abnormal movements (highest score from questions above): None, normal Incapacitation due to abnormal movements: None, normal, Dental Status Current problems with teeth and/or dentures?: No Does patient usually wear dentures?: No  CIWA:  CIWA-Ar Total:  0 COWS:      Assessment: Patient presented after having a psychotic episode , at [facility]. Pt today continues to be depressed , anxious .Pt also with eleavted TSH - awaiting follow up labs - this may need to be addressed , which could be another contributing factor to his current sx.     Treatment Plan Summary: Daily contact with patient to assess and evaluate symptoms and progress in treatment and Medication management   Rule out Hypothyroidism contributing to tiredness. T3 and T4 are pending. Consult when results available.   Continue Prozac to 40 mg po daily for affective sx. Consider change to wellbutrin if remains tired.  Will continue Risperidone 0.5 mg po qhs for psychosis. AIMS - 0 ( 12/12/14) Continue Cogentin 0.5 mg po qhs for EPS. CIWA/Librium prn for alcohol, BZD withdrawal sx- pt with UDS positive for BZD - reports he did not abuse it after [facility], which was [facility] days ago. Will continue to monitor vitals ,medication compliance and treatment side effects while patient is here.  Will monitor for medical issues as well as call consult as needed.   Lipid panel - abnormal - continue Zocor 20 mg po qpm. CSW will start working on disposition. Pt to be referred to substance abuse program due to severe hx  of alcohol, BZD abuse. Patient to participate in therapeutic milieu .   Medical Decision Making:  Review of Psycho-Social Stressors (1), Review or order clinical lab tests (1), Review of Last Therapy Session (1), Review of Medication Regimen & Side Effects (2) and Review of New Medication or Change in Dosage (2)     Belky Mundo MD 12/14/2014, 11:13 AM

## 2014-12-14 NOTE — Progress Notes (Signed)
D Nickey is seen OOB UAL on the 500 hall today..he tolerates this fairly well. HE is more quiet and anxious-appearing today. He did not attend his morning group and told this nurse he is " worried" about getting his belongings at [facility].   A HE is given prn librium at 1814 per his request.    R Safety is in place and poc cont.Pt denies SI.

## 2014-12-15 LAB — T3, FREE: T3, Free: 3.2 pg/mL (ref 2.0–4.4)

## 2014-12-15 LAB — PROLACTIN: PROLACTIN: 13.3 ng/mL (ref 4.0–15.2)

## 2014-12-15 LAB — T4: T4, Total: 6.5 ug/dL (ref 4.5–12.0)

## 2014-12-15 MED ORDER — GABAPENTIN 100 MG PO CAPS: 100.0000 mg | ORAL_CAPSULE | Freq: Two times a day (BID) | ORAL | Status: DC

## 2014-12-15 NOTE — [facility] Group Notes (Signed)
[facility] LCS[facility] Discharge Planning Group Note   12/15/2014  8:45 AM  Participation Quality:  Did Not Attend  Reyes IvanChelsea Horton, LCSW 12/15/2014 10:47 AM

## 2014-12-15 NOTE — [facility] Suicide Risk Assessment (Signed)
[facility] INPATIENT:  Family/Significant Other Suicide Prevention Education  Suicide Prevention Education:  Contact Attempts: Rodney Langton - sister [phone]), (name of family member/significant other) has been identified by the patient as the family member/significant other with whom the patient will be residing, and identified as the person(s) who will aid the patient in the event of a mental health crisis.  With written consent from the patient, two attempts were made to provide suicide prevention education, prior to and/or following the patient's discharge.  We were unsuccessful in providing suicide prevention education.  A suicide education pamphlet was given to the patient to share with family/significant other.  Date and time of first attempt: 12/11/14 @ 5:05 pm - number not in service Date and time of second attempt: 12/15/14 @ 4:15 pm - number not in service  Horton, Salome Arnt 12/15/2014, 4:18 PM

## 2014-12-15 NOTE — Progress Notes (Signed)
Patient ID: Ruben Marks, male   DOB: 06-09-1966, 49 y.o.   MRN: [id number] [facility] MD Progress Note  12/15/2014 2:52 PM Dell Briner  MRN:  [id number] Subjective: Patient states " I do not feel too good, I feel jittery , depressed , I am not sure why.'    Objective: Patient seen and chart reviewed.Discussed patient with treatment team. Patient presented after having a psychotic episode at recovery house.  Pt continues to feel depressed , however would like to stay on Prozac, since he is aware it might take longer to work. Pt also with possible activating sx of being on Prozac like feeling jittery , anxious . Discussed to monitor his sx- provided prn medications if he cannot cope with his mood sx . Pt denies any SI today - is very motivated to get help with his substance abuse problem . Pt to have a phone interview today/tomorrow - pt to is very eager about this . Pt with elevated TSH levels - however T3,T4 - wnl - hence discussed monitoring this on an out patient basis- no acute management required at this time. Pt denies paranoia - reports he exaggerated whatever was going on in his last recovery house - and felt being criticized a lot which led to his decompensation. Pt has not been paranoid since admission- his major concern currently being anxiety, depression. Pt has heard AH twice in his entire life- once , right before admission. Pt to observe self - ask for help if needed.       Principal Problem: MDD (major depressive disorder), recurrent, severe, with psychosis Diagnosis:   Patient Active Problem List   Diagnosis Date Noted  . Elevated TSH [R94.6] 12/12/2014  . Hyperlipidemia [E78.5] 12/12/2014  . MDD (major depressive disorder), recurrent, severe, with psychosis [F33.3] 12/10/2014  . GAD (generalized anxiety disorder) [F41.1] 12/10/2014  . Alcohol use disorder, severe, in early remission [F10.99] 12/10/2014  . 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 Hypertension   . 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 Anxiety   . Depression    History reviewed. No pertinent past surgical history. Family History:  Family History  Problem Relation Age of Onset  . Anxiety disorder Mother    Social History:  History  Alcohol Use  . Yes    Comment: just 11/20/2014     History  Drug Use  . Yes  . Special: Benzodiazepines    History   Social History  . Marital Status: Unknown    Spouse Name: N/A  . Number of Children: N/A  . Years of Education: N/A   Social History Main Topics  . Smoking status: Former Games developer  . Smokeless tobacco: Not on file  . Alcohol Use: Yes     Comment: just 11/20/2014  . Drug Use: Yes    Special: Benzodiazepines  . Sexual Activity: No   Other Topics Concern  . None   Social History Narrative   Additional History:    Sleep: Fair  Appetite:  Fair     Musculoskeletal: Strength & Muscle Tone: within normal limits Gait & Station: normal Patient leans: N/A   Psychiatric Specialty Exam: Physical Exam  Review of Systems  Constitutional: Negative for fever.  Cardiovascular: Negative for chest pain.  Skin:       Lateral aspect of penis   Neurological: Negative for weakness and headaches.  Psychiatric/Behavioral: Positive for depression and substance abuse.  All other systems reviewed and are negative.   Blood pressure 113/83, pulse 53, temperature 97.2 F (36.2 C), temperature source Oral, resp. rate 20, height 6\' 3"  (1.905 m), weight 109.317 kg (241 lb), SpO2 97 %.Body mass index is 30.12 kg/(m^2).  General Appearance: Fairly Groomed  Patent attorneyye Contact::  Fair  Speech:  Normal Rate  Volume:  Decreased  Mood: anxious , jittery  Affect:  Congruent  Thought Process:  Linear  Orientation:  Full (Time, Place, and Person)  Thought Content:  Rumination  Suicidal Thoughts:  No  Homicidal Thoughts:  No   Memory:  Immediate;   Fair Recent;   Fair Remote;   Fair  Judgement:  Impaired  Insight:  Fair  Psychomotor Activity:  Restlessness  Concentration:  Poor  Recall:  Fis[employer]Fair  Fund of Knowledge:Fair  Language: Fair  Akathisia:  No  Handed:  Right  AIMS (if indicated):     Assets:  Desire for Improvement  ADL's:  Intact  Cognition: WNL  Sleep:  Number of Hours: 6.75     Current Medications: Current Facility-Administered Medications  Medication Dose Route Frequency Provider Last Rate Last Dose  . acetaminophen (TYLENOL) tablet 650 mg  650 mg Oral Q6H PRN Kerry HoughSpencer E Simon, PA-C   650 mg at 12/13/14 1425  . alum & mag hydroxide-simeth (MAALOX/MYLANTA) 200-200-20 MG/5ML suspension 30 mL  30 mL Oral Q4H PRN Kerry HoughSpencer E Simon, PA-C      . carvedilol (COREG) tablet 12.5 mg  12.5 mg Oral BID WC Kerry HoughSpencer E Simon, PA-C   12.5 mg at 12/15/14 0835  . chlordiazePOXIDE (LIBRIUM) capsule 25 mg  25 mg Oral QID PRN Jomarie LongsSaramma Khloi Rawl, MD   25 mg at 12/14/14 1814  . FLUoxetine (PROZAC) tablet 40 mg  40 mg Oral Daily Starsha Morning, MD   40 mg at 12/15/14 0835  . hydrochlorothiazide (MICROZIDE) capsule 12.5 mg  12.5 mg Oral Daily Tabbitha Janvrin, MD   12.5 mg at 12/15/14 0835  . hydrOXYzine (ATARAX/VISTARIL) tablet 25 mg  25 mg Oral Q6H PRN Kerry HoughSpencer E Simon, PA-C   25 mg at 12/14/14 2105  . losartan (COZAAR) tablet 100 mg  100 mg Oral Daily Kerry HoughSpencer E Simon, PA-C   100 mg at 12/15/14 [id number]  . magnesium hydroxide (MILK OF MAGNESIA) suspension 30 mL  30 mL Oral Daily PRN Kerry HoughSpencer E Simon, PA-C      . simvastatin (ZOCOR) tablet 20 mg  20 mg Oral q1800 Jomarie LongsSaramma Zariah Cavendish, MD   20 mg at 12/14/14 1814  . thiamine (VITAMIN B-1) tablet 100 mg  100 mg Oral Daily Kerry HoughSpencer E Simon, PA-C   100 mg at 12/15/14 [id number]    Lab Results:  No results found for this or any previous visit (from the past 48 hour(s)).  Physical Findings: AIMS: Facial and Oral Movements Muscles of Facial Expression: None, normal Lips and Perioral Area: None,  normal Jaw: None, normal Tongue: None, normal,Extremity Movements Upper (arms, wrists, hands, fingers): None, normal Lower (legs, knees, ankles, toes): None, normal, Trunk Movements Neck, shoulders, hips: None, normal, Overall Severity Severity of abnormal movements (highest score from questions above): None, normal Incapacitation due to abnormal movements: None, normal, Dental Status Current problems with teeth and/or dentures?: No Does patient usually wear dentures?: No  CIWA:  CIWA-Ar Total: 0 COWS:      Assessment: Patient presented after having a psychotic episode at the recovery house - continues to be anxious , jittery , restless.  Will continue treatment.   Treatment Plan Summary: Daily contact with patient to assess and evaluate symptoms and progress in treatment and Medication management    Continue Prozac  40 mg po daily for affective sx.Patient feels anxious, jittery today - mostly 2/2 medication side effect. Will provide Vistaril 25 mg po prn for anxiety, jittery sx. Will discontinue Risperidone - pt denies any paranoia while here - also has been refusing to take it. Pt to observe self for worsening sx- and talk to MD if he feels resurgence of psychotic sx. CIWA/Librium prn for alcohol, BZD withdrawal sx- pt with UDS positive for BZD . Will continue to monitor vitals ,medication compliance and treatment side effects while patient is here.  Will monitor for medical issues as well as call consult as needed.  Patient with elevated TSH- T3/T4 WNL. Lipid panel - abnormal - continue Zocor 20 mg po qpm. CSW will start working on disposition. Pt to be referred to substance abuse program due to severe hx of alcohol, BZD abuse. Patient to participate in therapeutic milieu .   Medical Decision Making:  Review of Psycho-Social Stressors (1), Review or order clinical lab tests (1), Review of Last Therapy Session (1), Review of Medication Regimen & Side Effects (2) and Review of New  Medication or Change in Dosage (2)     Ernesteen Mihalic MD 12/15/2014, 2:52 PM

## 2014-12-15 NOTE — Tx Team (Signed)
Interdisciplinary Treatment Plan Update (Adult)  Date: 12/15/2014  Time Reviewed:  9:45 AM  Progress in Treatment: Attending groups: Yes Participating in groups:  Yes Taking medication as prescribed:  Yes Tolerating medication:  Yes Family/Significant othe contact made: CSW assessing  Patient understands diagnosis:  Yes Discussing patient identified problems/goals with staff:  Yes Medical problems stabilized or resolved:  Yes Denies suicidal/homicidal ideation: Yes Issues/concerns per patient self-inventory:  Yes Other:  New problem(s) identified: N/A  Discharge Plan or Barriers: Pt has a phone interview with [facility] tomorrow.     Reason for Continuation of Hospitalization: Anxiety Depression Medication Stabilization  Comments: N/A  Estimated length of stay: 1-2 days  For review of initial/current patient goals, please see plan of care.  Attendees: Patient:     Family:     Physician:  Dr. Elna Breslow 12/15/2014 11:45 AM   Nursing:   Lendell Caprice, RN 12/15/2014 11:45 AM   Clinical Social Worker:  Reyes Ivan, LCSW 12/15/2014 11:45 AM   Other: Quintella Reichert, RN 12/15/2014 11:45 AM   Other:  Dorothyann Peng, LCSW   Other:     Other:     Other:    Other:    Other:    Other:    Other:    Other:     Scribe for Treatment Team:   Carmina Miller, 12/15/2014 11:45 AM

## 2014-12-15 NOTE — Plan of Care (Signed)
Problem: Food- and Nutrition-Related Knowledge Deficit (NB-1.1) Goal: Nutrition education Formal process to instruct or train a patient/client in a skill or to impart knowledge to help patients/clients voluntarily manage or modify food choices and eating behavior to maintain or improve health. Outcome: Completed/Met Date Met:  12/15/14 Nutrition Education Note  RD consulted for nutrition education regarding hyperlipidemia.  Lipid Panel     Component Value Date/Time    CHOL 262* 12/12/2014 0632    TRIG 237* 12/12/2014 0632    HDL 42 12/12/2014 0632    CHOLHDL 6.2 12/12/2014 0632    VLDL 47* 12/12/2014 0632    LDLCALC 173* 12/12/2014 0919    RD provided "High Triglycerides Nutrition Therapy" handout from the Academy of Nutrition and Dietetics. Reviewed patient's dietary recall. Provided examples on ways to decrease sugar and fat intake in diet. Discouraged intake of processed foods and sugar-sweetened beverages. Encouraged fresh fruits and vegetables as well as whole grain sources of carbohydrates to maximize fiber intake. Teach back method used.  Expect good compliance. Pt very receptive to education.   Body mass index is 30.12 kg/(m^2). Pt meets criteria for obesity based on current BMI.  Diet Order: Diet regular Room service appropriate?: Yes; Fluid consistency:: Thin Pt is also offered choice of unit snacks mid-morning and mid-afternoon.  Pt is eating as desired.   Labs and medications reviewed. No further nutrition interventions warranted at this time. If additional nutrition issues arise, please re-consult RD.  Clayton Bibles, MS, RD, LDN Pager: [phone] After Hours Pager: [phone]

## 2014-12-15 NOTE — Clinical Social Work Note (Addendum)
Patient has a care coordinator, Lindon Romp @ [phone].  Santa Genera, LCSW Clinical Social Worker

## 2014-12-15 NOTE — Plan of Care (Signed)
Problem: Ineffective individual coping Goal: STG: Patient will remain free from self harm Outcome: Progressing Patient remains free from self harm. 15 minute checks continued per protocol for patient safety.   Problem: Alteration in mood Goal: LTG-Patient reports reduction in suicidal thoughts (Patient reports reduction in suicidal thoughts and is able to verbalize a safety plan for whenever patient is feeling suicidal)  Outcome: Progressing Patient denies having any suicidal thoughts today.  Problem: Diagnosis: Increased Risk For Suicide Attempt Goal: STG-Patient Will Attend All Groups On The Unit Outcome: Progressing Patient is attending unit groups today. Goal: STG-Patient Will Comply With Medication Regime Outcome: Progressing Patient has adhered to medication regimen today with ease.     

## 2014-12-15 NOTE — Progress Notes (Signed)
D: Patient denies SI/HI/AVH. Patient shared he was excepted into a rehab program today 12/15/2014. That it is a 9 month [place] [facility] [place]. Patient seemed very positive about the move. At 2212 patient asked for PRN Hydroxyzine 25mg  to help him sleep. PRN was given.  Patient is currently resting in bed with eyes closed. Respiration are non labored and even.   A: Staff to monitor Q 15 mins for safety. Encouragement and support offered. Scheduled medications administered per orders. R: Patient remains safe on the unit. Patient attended group tonight. Patient visible on hte unit and interacting with peers. Patient taking administered medications.

## 2014-12-15 NOTE — Clinical Social Work Note (Signed)
CSW spoke with Ruben Marks, care coordinator for [facility], [phone] at this time.  Ruben Marks requests CSW call to follow up with pt's discharge plan at discharge.    Reyes Ivan, LCSW 12/15/2014  3:32 PM

## 2014-12-15 NOTE — Progress Notes (Signed)
D: Patient is alert and oriented. Pt's mood and affect is depressed, anxious and blunted. Pt denies SI/HI and AVH. Pt reports he is "feeling confused, noises are just bothering me." Pt is oriented x4. Pt is hypertensive, denies symptoms. Pt is attending unit groups today. A: Active listening by RN. Encouragement/Support provided to pt. Will reassess BP frequently. MD Eappen made aware of pt's hypervigilance. Medication education reviewed with pt. Scheduled medications administered per providers orders (See MAR). 15 minute checks continued per protocol for patient safety.  R: Patient cooperative and receptive to nursing interventions. Pt remains safe.

## 2014-12-15 NOTE — [facility] Group Notes (Signed)
[facility] LCSW Group Therapy  12/15/2014   1:15 PM   Type of Therapy:  Group Therapy  Participation Level:  Active  Participation Quality:  Attentive, Sharing and Supportive  Affect:  Flat and Depressed  Cognitive:  Alert and Oriented  Insight:  Developing/Improving and Engaged  Engagement in Therapy:  Developing/Improving and Engaged  Modes of Intervention:  Clarification, Confrontation, Discussion, Education, Exploration, Limit-setting, Orientation, Problem-solving, Rapport Building, Dance movement psychotherapisteality Testing, Socialization and Support  Summary of Progress/Problems: Pt identified obstacles faced currently and processed barriers involved in overcoming these obstacles. Pt identified steps necessary for overcoming these obstacles and explored motivation (internal and external) for facing these difficulties head on. Pt further identified one area of concern in their lives and chose a goal to focus on for today.  Pt shared that he struggles with depression and anxiety and coped by drinking.  Pt states that the anti depressant he's on now is really helping with his depression and anxiety and hopes that with going to another treatment center he will be able to maintain sobriety.  Pt actively participated and was engaged in group discussion.     Ruben IvanChelsea Horton, LCSW 12/15/2014  2:40 PM

## 2014-12-15 NOTE — Plan of Care (Addendum)
Problem: Alteration in mood & ability to function due to Goal: STG-Patient will attend groups Outcome: Progressing Patient attended evening group 12/14/14

## 2014-12-16 MED ORDER — FLUOXETINE HCL 20 MG PO TABS: 40.0000 mg | ORAL_TABLET | Freq: Every day | ORAL | Status: DC

## 2014-12-16 MED ORDER — SIMVASTATIN 20 MG PO TABS: 20.0000 mg | ORAL_TABLET | Freq: Every day | ORAL | Status: DC

## 2014-12-16 MED ORDER — HYDROXYZINE HCL 25 MG PO TABS: 25.0000 mg | ORAL_TABLET | Freq: Four times a day (QID) | ORAL | Status: DC | PRN

## 2014-12-16 MED ORDER — LOSARTAN POTASSIUM 100 MG PO TABS: 100.0000 mg | ORAL_TABLET | Freq: Every day | ORAL | Status: DC

## 2014-12-16 MED ORDER — HYDROCHLOROTHIAZIDE 12.5 MG PO CAPS: 12.5000 mg | ORAL_CAPSULE | Freq: Every day | ORAL | Status: DC

## 2014-12-16 MED ORDER — THIAMINE HCL 100 MG PO TABS: 100.0000 mg | ORAL_TABLET | Freq: Every day | ORAL | Status: DC

## 2014-12-16 MED ORDER — CARVEDILOL 12.5 MG PO TABS: 12.5000 mg | ORAL_TABLET | Freq: Two times a day (BID) | ORAL | Status: DC

## 2014-12-16 NOTE — Plan of Care (Signed)
Problem: Ineffective individual coping Goal: STG-Increase in ability to manage activities of daily living Outcome: Progressing Patient continues to increase ability to manage activities of daily living.

## 2014-12-16 NOTE — [facility] Group Notes (Signed)
[facility] Group Notes:  (Nursing/MHT/Case Management/Adjunct)  Date:  12/16/2014  Time:  0915am  Type of Therapy:  Nurse Education  Participation Level:  Active  Participation Quality:  Appropriate and Attentive  Affect:  Appropriate  Cognitive:  Alert and Appropriate  Insight:  Appropriate and Good  Engagement in Group:  Engaged  Modes of Intervention:  Discussion, Education and Support  Summary of Progress/Problems: Patient attended group, remained engaged, and responded appropriately when prompted.  Ruben Marks, Ruben Marks 12/16/2014, 10:16 AM

## 2014-12-16 NOTE — Progress Notes (Signed)
[facility]. D/c instructions, scripts,and medication samples reviewed and given. Pt verbalizes understanding. Denies s.i.

## 2014-12-16 NOTE — Plan of Care (Signed)
Problem: Alteration in mood & ability to function due to Goal: STG-Patient will attend groups Outcome: Progressing Patient continues to attend evening group session. 12/15/2014

## 2014-12-16 NOTE — [facility] Group Notes (Signed)
[facility] LCSW Group Therapy 12/16/2014  1:15 PM   Type of Therapy: Group Therapy  Participation Level: Did Not Attend. Patient invited to participate but declined.   Samuella BruinKristin Valaree Fresquez, MSW, [employer] IncLCSWA Clinical Social Worker [facility] [phone]

## 2014-12-16 NOTE — Discharge Summary (Deleted)
Physician Discharge Summary Note  Patient:  Ruben Marks is an 49 y.o., male MRN:  [id number] DOB:  Dec 21, 1965 Patient phone:  [phone] (home)  Patient address:   [address] [place] [address],  Total Time spent with patient: 45 minutes  Date of Admission:  12/10/2014 Date of Discharge: 12/16/2014  Reason for Admission:  Depression  Principal Problem: MDD (major depressive disorder), recurrent, severe, with psychosis Discharge Diagnoses: Patient Active Problem List   Diagnosis Date Noted  . Elevated TSH [R94.6] 12/12/2014  . Hyperlipidemia [E78.5] 12/12/2014  . MDD (major depressive disorder), recurrent, severe, with psychosis [F33.3] 12/10/2014  . GAD (generalized anxiety disorder) [F41.1] 12/10/2014  . Alcohol use disorder, severe, in early remission [F10.99] 12/10/2014  . Moderate benzodiazepine use disorder [F13.90] 12/10/2014    Musculoskeletal: Strength & Muscle Tone: within normal limits Gait & Station: normal Patient leans: N/A  Psychiatric Specialty Exam:  SEE SRA Physical Exam  Vitals reviewed.   Review of Systems  All other systems reviewed and are negative.   Blood pressure 132/93, pulse 61, temperature 97.5 F (36.4 C), temperature source Oral, resp. rate 18, height  (1.905 m), weight 109.317 kg (241 lb), SpO2 97 %.Body mass index is 30.12 kg/(m^2).  Have you used any form of tobacco in the last 30 days? (Cigarettes, Smokeless Tobacco, Cigars, and/or Pipes): No  Has this patient used any form of tobacco in the last 30 days? (Cigarettes, Smokeless Tobacco, Cigars, and/or Pipes) Yes, A prescription for an FDA-approved tobacco cessation medication was offered at discharge and the patient refused  Past Medical History:  Past Medical History  Diagnosis Date  . Hypertension   . 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 Anxiety   . Depression    History reviewed. No pertinent past surgical history. Family History:  Family History  Problem  Relation Age of Onset  . Anxiety disorder Mother    Social History:  History  Alcohol Use  . Yes    Comment: just 11/20/2014     History  Drug Use  . Yes  . Special: Benzodiazepines    History   Social History  . Marital Status: Unknown    Spouse Name: N/A  . Number of Children: N/A  . Years of Education: N/A   Social History Main Topics  . Smoking status: Former Games developer  . Smokeless tobacco: Not on file  . Alcohol Use: Yes     Comment: just 11/20/2014  . Drug Use: Yes    Special: Benzodiazepines  . Sexual Activity: No   Other Topics Concern  . None   Social History Narrative    Risk to Self: Is patient at risk for suicide?: Yes What has been your use of drugs/alcohol within the last 12 months?: daily until Pt was detoxed on 11/20/14 Risk to Others:   Prior Inpatient Therapy:   Prior Outpatient Therapy:    Level of Care:  OP  Hospital Course:  Ruben Marks was admitted for MDD (major depressive disorder), recurrent, severe, with psychosis and crisis management.  She was treated discharged with the medications listed below under Medication List.  Medical problems were identified and treated as needed.  Home medications were restarted as appropriate.  Improvement was monitored by observation and Ruben Marks daily report of symptom reduction.  Emotional and mental status was monitored by daily self-inventory reports completed by Ruben Marks and clinical staff.         Ruben Marks was evaluated by the treatment team  for stability and plans for continued recovery upon discharge.  Ruben Marks motivation was an integral factor for scheduling further treatment.  Employment, transportation, bed availability, health status, family support, and any pending legal issues were also considered during his hospital stay.  He was offered further treatment options upon discharge including but not limited to Residential, Intensive Outpatient, and Outpatient treatment.  Ruben Marks  will follow up with the services as listed below under Follow Up Information.     Upon completion of this admission the patient was both mentally and medically stable for discharge denying suicidal/homicidal ideation, auditory/visual/tactile hallucinations, delusional thoughts and paranoia.      Consults:  psychiatry  Significant Diagnostic Studies:  labs: per ED  Discharge Vitals:   Blood pressure 132/93, pulse 61, temperature 97.5 F (36.4 C), temperature source Oral, resp. rate 18, height 6\' 3"  (1.905 m), weight 109.317 kg (241 lb), SpO2 97 %. Body mass index is 30.12 kg/(m^2). Lab Results:   No results found for this or any previous visit (from the past 72 hour(s)).  Physical Findings: AIMS: Facial and Oral Movements Muscles of Facial Expression: None, normal Lips and Perioral Area: None, normal Jaw: None, normal Tongue: None, normal,Extremity Movements Upper (arms, wrists, hands, fingers): None, normal Lower (legs, knees, ankles, toes): None, normal, Trunk Movements Neck, shoulders, hips: None, normal, Overall Severity Severity of abnormal movements (highest score from questions above): None, normal Incapacitation due to abnormal movements: None, normal, Dental Status Current problems with teeth and/or dentures?: No Does patient usually wear dentures?: No  CIWA:  CIWA-Ar Total: 0 COWS:      See Psychiatric Specialty Exam and Suicide Risk Assessment completed by Attending Physician prior to discharge.  Discharge destination:  Home  Is patient on multiple antipsychotic therapies at discharge:  No   Has Patient had three or more failed trials of antipsychotic monotherapy by history:  No    Recommended Plan for Multiple Antipsychotic Therapies: NA     Medication List    STOP taking these medications        losartan-hydrochlorothiazide 100-12.5 MG per tablet  Commonly known as:  HYZAAR     traZODone 50 MG tablet  Commonly known as:  DESYREL      TAKE these  medications      Indication   carvedilol 12.5 MG tablet  Commonly known as:  COREG  Take 1 tablet (12.5 mg total) by mouth 2 (two) times daily with a meal.   Indication:  High Blood Pressure of Unknown Cause     FLUoxetine 20 MG tablet  Commonly known as:  PROZAC  Take 2 tablets (40 mg total) by mouth daily.   Indication:  Major Depressive Disorder     hydrochlorothiazide 12.5 MG capsule  Commonly known as:  MICROZIDE  Take 1 capsule (12.5 mg total) by mouth daily.   Indication:  High Blood Pressure     hydrOXYzine 25 MG tablet  Commonly known as:  ATARAX/VISTARIL  Take 1 tablet (25 mg total) by mouth every 6 (six) hours as needed for anxiety.   Indication:  Anxiety Neurosis     losartan 100 MG tablet  Commonly known as:  COZAAR  Take 1 tablet (100 mg total) by mouth daily.   Indication:  High Blood Pressure     simvastatin 20 MG tablet  Commonly known as:  ZOCOR  Take 1 tablet (20 mg total) by mouth daily at 6 PM.   Indication:  Hyperlipidemia     thiamine 100  MG tablet  Take 1 tablet (100 mg total) by mouth daily.   Indication:  Deficiency in Thiamine or Vitamin B1           Follow-up Information    Follow up with [facility].      Follow up with [facility].   Why:  Walk-in clinic Monday-Friday between 8 am to 3 pm for assessment for therapy and medication management services.   Contact information:   [address] [place], [place] [address] Phone: [phone] Fax: [phone]      Follow-up recommendations:  Activity:  as tol, diet as tol  Comments:  1.  Take all your medications as prescribed.              2.  Report any adverse side effects to outpatient provider.                       3.  Patient instructed to not use alcohol or illegal drugs while on prescription medicines.            4.  In the event of worsening symptoms, instructed patient to call 911, the crisis hotline or go to nearest emergency room for evaluation of symptoms.  Total Discharge Time:   40 min  Signed: Velna HatchetSheila May Willie Plain AGNP-BC 12/16/2014, 5:46 PM

## 2014-12-16 NOTE — [facility] Suicide Risk Assessment (Signed)
[facility]   Demographic Factors:  Male and Caucasian  Total Time spent with patient: 30 minutes  Musculoskeletal: Strength & Muscle Tone: within normal limits Gait & Station: normal Patient leans: N/A  Psychiatric Specialty Exam: Physical Exam  Review of Systems  Psychiatric/Behavioral: Positive for substance abuse.  All other systems reviewed and are negative.   Blood pressure 132/93, pulse 61, temperature 97.5 F (36.4 C), temperature source Oral, resp. rate 18, height 6\' 3"  (1.905 m), weight 109.317 kg (241 lb), SpO2 97 %.Body mass index is 30.12 kg/(m^2).  General Appearance: Casual  Eye Contact::  Fair  Speech:  Clear and Coherent409  Volume:  Normal  Mood:  Euthymic  Affect:  Appropriate  Thought Process:  Coherent  Orientation:  Full (Time, Place, and Person)  Thought Content:  WDL  Suicidal Thoughts:  No  Homicidal Thoughts:  No  Memory:  Immediate;   Fair Recent;   Fair Remote;   Fair  Judgement:  Fair  Insight:  Fair  Psychomotor Activity:  Normal  Concentration:  Fair  Recall:  FiservFair  Fund of Knowledge:Fair  Language: Fair  Akathisia:  No  Handed:  Right  AIMS (if indicated):     Assets:  Communication Skills Desire for Improvement  Sleep:  Number of Hours: 5  Cognition: WNL  ADL's:  Intact   Have you used any form of tobacco in the last 30 days? (Cigarettes, Smokeless Tobacco, Cigars, and/or Pipes): No  Has this patient used any form of tobacco in the last 30 days? (Cigarettes, Smokeless Tobacco, Cigars, and/or Pipes) No  Mental Status Per Nursing Assessment::   On Admission:  Suicidal ideation indicated by patient, Suicide plan, Self-harm thoughts  Current Mental Status by Physician: PATIENT DENIES SI/HI/AH/VH  Loss Factors: NA  Historical Factors: Impulsivity  Risk Reduction Factors:   Positive therapeutic relationship and Positive coping skills or problem solving skills  Continued Clinical Symptoms:   Alcohol/Substance Abuse/Dependencies Previous Psychiatric Diagnoses and Treatments  Cognitive Features That Contribute To Risk:  None    Suicide Risk:  Minimal: No identifiable suicidal ideation.  Patients presenting with no risk factors but with morbid ruminations; may be classified as minimal risk based on the severity of the depressive symptoms  Principal Problem: MDD (major depressive disorder), recurrent, severe, with psychosis Discharge Diagnoses:  Patient Active Problem List   Diagnosis Date Noted  . Elevated TSH [R94.6] 12/12/2014  . Hyperlipidemia [E78.5] 12/12/2014  . MDD (major depressive disorder), recurrent, severe, with psychosis [F33.3] 12/10/2014  . GAD (generalized anxiety disorder) [F41.1] 12/10/2014  . Alcohol use disorder, severe, in early remission [F10.99] 12/10/2014  . Moderate benzodiazepine use disorder [F13.90] 12/10/2014    Follow-up Information    Follow up with [facility].      Follow up with [facility].   Contact information:   [address] [place], [place] [address] Phone: [phone] Fax: [phone]      Plan Of Care/Follow-up recommendations:  Activity:  No restrictions Diet:  low fat diet Tests:  follow up lipid panel as recommended by out patient primary doctor , follow up with on your TSH level since it was elevated - q6 monthly. Other:  [facility].  Is patient on multiple antipsychotic therapies at discharge:  No   Has Patient had three or more failed trials of antipsychotic monotherapy by history:  No  Recommended Plan for Multiple Antipsychotic Therapies: NA    Gianni Mihalik MD 12/16/2014, 9:32 AM

## 2014-12-16 NOTE — Discharge Summary (Signed)
Physician Discharge Summary Note  Patient:  Ruben Marks is an 49 y.o., male MRN:  [id number] DOB:  10/13/1965 Patient phone:  [phone] (home)  Patient address:   [address] [place] [address],  Total Time spent with patient: 45 minutes  Date of Admission:  12/10/2014 Date of Discharge: 12/16/2014  Reason for Admission:  Depression  Principal Problem: MDD (major depressive disorder), recurrent, severe, with psychosis Discharge Diagnoses: Patient Active Problem List   Diagnosis Date Noted  . Elevated TSH [R94.6] 12/12/2014  . Hyperlipidemia [E78.5] 12/12/2014  . MDD (major depressive disorder), recurrent, severe, with psychosis [F33.3] 12/10/2014  . GAD (generalized anxiety disorder) [F41.1] 12/10/2014  . Alcohol use disorder, severe, in early remission [F10.99] 12/10/2014  . Moderate benzodiazepine use disorder [F13.90] 12/10/2014    Musculoskeletal: Strength & Muscle Tone: within normal limits Gait & Station: normal Patient leans: N/A  Psychiatric Specialty Exam:  SEE SRA Physical Exam  Vitals reviewed. Psychiatric: His speech is normal and behavior is normal. Judgment and thought content normal. His mood appears anxious. Cognition and memory are normal.    Review of Systems  All other systems reviewed and are negative.   Blood pressure 132/93, pulse 61, temperature 97.5 F (36.4 C), temperature source Oral, resp. rate 18, height 6\' 3"  (1.905 m), weight 109.317 kg (241 lb), SpO2 97 %.Body mass index is 30.12 kg/(m^2).  Have you used any form of tobacco in the last 30 days? (Cigarettes, Smokeless Tobacco, Cigars, and/or Pipes): No  Has this patient used any form of tobacco in the last 30 days? (Cigarettes, Smokeless Tobacco, Cigars, and/or Pipes) N/A  Past Medical History:  Past Medical History  Diagnosis Date  . Hypertension   . 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 Anxiety   . Depression    History reviewed. No pertinent past surgical history. Family  History:  Family History  Problem Relation Age of Onset  . Anxiety disorder Mother    Social History:  History  Alcohol Use  . Yes    Comment: just 11/20/2014     History  Drug Use  . Yes  . Special: Benzodiazepines    History   Social History  . Marital Status: Unknown    Spouse Name: N/A  . Number of Children: N/A  . Years of Education: N/A   Social History Main Topics  . Smoking status: Former Games developermoker  . Smokeless tobacco: Not on file  . Alcohol Use: Yes     Comment: just 11/20/2014  . Drug Use: Yes    Special: Benzodiazepines  . Sexual Activity: No   Other Topics Concern  . None   Social History Narrative   Risk to Self: Is patient at risk for suicide?: Yes What has been your use of drugs/alcohol within the last 12 months?: daily until Pt was detoxed on 11/20/14 Risk to Others:   Prior Inpatient Therapy:   Prior Outpatient Therapy:    Level of Care:  OP  Hospital Course:  Ruben MawMichael Marks is a 49 y.o, homeless , unemployed - [facility] [place]  He was was brought to the [facility] at his own request by his house director at the recovery house.  Ruben Marks states that he wanted to cut off his pinkie finger.  He stated overhearing housemates talking, "a male voice" and was paranoid that they were talking about him and the voice telling him to kill himself.  He started to have SI and wanted to cut  his wrists.   Ruben Marks when tested tonight was <5. UDS showed positive for Benzos; negative for all else.  Ruben Marks was admitted for MDD (major depressive disorder), recurrent, severe, with psychosis and crisis management.  He was treated discharged with the medications listed below under Medication List.  Medical problems were identified and treated as needed.  Home medications were restarted as appropriate.  Improvement was monitored by observation and Ruben Maw daily report of symptom reduction.  Emotional and mental status was monitored by  daily self-inventory reports completed by Ruben Maw and clinical staff.         Ruben Marks was evaluated by the treatment team for stability and plans for continued recovery upon discharge.  Ruben Marks motivation was an integral factor for scheduling further treatment.  Employment, transportation, bed availability, health status, family support, and any pending legal issues were also considered during his hospital stay.  He was offered further treatment options upon discharge including but not limited to Residential, Intensive Outpatient, and Outpatient treatment.  Ruben Marks will follow up with the services as listed below under Follow Up Information.     Upon completion of this admission the patient was both mentally and medically stable for discharge denying suicidal/homicidal ideation, auditory/visual/tactile hallucinations, delusional thoughts and paranoia.       Consults:  psychiatry  Significant Diagnostic Studies:  labs: per ED, UDS +ve BENZO.  RPR/HIV/Herpes Simplex/GC/Chlam --- all negative.    Discharge Vitals:   Blood pressure 132/93, pulse 61, temperature 97.5 F (36.4 C), temperature source Oral, resp. rate 18, height  (1.905 m), weight 109.317 kg (241 lb), SpO2 97 %. Body mass index is 30.12 kg/(m^2). Lab Results:   No results found for this or any previous visit (from the past 72 hour(s)).  Physical Findings: AIMS: Facial and Oral Movements Muscles of Facial Expression: None, normal Lips and Perioral Area: None, normal Jaw: None, normal Tongue: None, normal,Extremity Movements Upper (arms, wrists, hands, fingers): None, normal Lower (legs, knees, ankles, toes): None, normal, Trunk Movements Neck, shoulders, hips: None, normal, Overall Severity Severity of abnormal movements (highest score from questions above): None, normal Incapacitation due to abnormal movements: None, normal, Dental Status Current problems with teeth and/or dentures?: No Does patient  usually wear dentures?: No  CIWA:  CIWA-Ar Total: 0 COWS:      See Psychiatric Specialty Exam and Suicide Risk Assessment completed by Attending Physician prior to discharge.  Discharge destination:  Home  Is patient on multiple antipsychotic therapies at discharge:  No   Has Patient had three or more failed trials of antipsychotic monotherapy by history:  No    Recommended Plan for Multiple Antipsychotic Therapies: NA     Medication List    STOP taking these medications        losartan-hydrochlorothiazide 100-12.5 MG per tablet  Commonly known as:  HYZAAR     traZODone 50 MG tablet  Commonly known as:  DESYREL      TAKE these medications      Indication   carvedilol 12.5 MG tablet  Commonly known as:  COREG  Take 1 tablet (12.5 mg total) by mouth 2 (two) times daily with a meal.   Indication:  High Blood Pressure of Unknown Cause     FLUoxetine 20 MG tablet  Commonly known as:  PROZAC  Take 2 tablets (40 mg total) by mouth daily.   Indication:  Major Depressive Disorder     hydrochlorothiazide 12.5 MG capsule  Commonly known  as:  MICROZIDE  Take 1 capsule (12.5 mg total) by mouth daily.   Indication:  High Blood Pressure     hydrOXYzine 25 MG tablet  Commonly known as:  ATARAX/VISTARIL  Take 1 tablet (25 mg total) by mouth every 6 (six) hours as needed for anxiety.   Indication:  Anxiety Neurosis     losartan 100 MG tablet  Commonly known as:  COZAAR  Take 1 tablet (100 mg total) by mouth daily.   Indication:  High Blood Pressure     simvastatin 20 MG tablet  Commonly known as:  ZOCOR  Take 1 tablet (20 mg total) by mouth daily at 6 PM.   Indication:  Hyperlipidemia     thiamine 100 MG tablet  Take 1 tablet (100 mg total) by mouth daily.   Indication:  Deficiency in Thiamine or Vitamin B1           Follow-up Information    Follow up with [facility].      Follow up with [facility].   Why:  Walk-in clinic Monday-Friday between 8 am to 3 pm for  assessment for therapy and medication management services.   Contact information:   [address], [place] [address] Phone: [phone] Fax: [phone]      Follow-up recommendations:  Activity:  as tol, diet as tol  Comments:  1.  Take all your medications as prescribed.              2.  Report any adverse side effects to outpatient provider.                       3.  Patient instructed to not use alcohol or illegal drugs while on prescription medicines.            4.  In the event of worsening symptoms, instructed patient to call 911, the crisis hotline or go to nearest emergency room for evaluation of symptoms.  Total Discharge Time: 40 min  Signed: Velna Hatchet May Corrigan Kretschmer AGNP-BC 12/16/2014, 9:35 AM

## 2014-12-16 NOTE — Progress Notes (Signed)
The focus of this group is to help patients review their daily goal of treatment and discuss progress on daily workbooks. Pt attend the evening group session and responded to all discussion prompts from the Writer. Pt shared that today was a good day on the unit, the highlight of which was getting accepted into a 54mo program to begin upon discharge from the hospital. Pt was upbeat about this and optimistic about the future in his comments. He was also receptive to praise and encouragement form the Writer and his peers over continuing his treatment. Pt reported having no additional needs from Nursing Staff this evening. Pt's affect was appropriate.

## 2014-12-16 NOTE — Progress Notes (Signed)
  [facility] :  Will you be returning to the same living situation after discharge:  No. Patient plans to discharge to recovery house. At discharge, do you have transportation home?: Yes,  Patient will be provided with bus pass if he is unable to secure transportation Do you have the ability to pay for your medications: Yes,  patient will be provided with medication samples and prescriptions  Release of information consent forms completed and in the chart;  Patient's signature needed at discharge.  Patient to Follow up at: Follow-up Information    Follow up with [facility].      Follow up with [facility].   Why:  Walk-in clinic Monday-Friday between 8 am to 3 pm for assessment for therapy and medication management services.   Contact information:   [address] [place], [place] [address] Phone: [phone] Fax: [phone]      Patient denies SI/HI: Yes,  denies    Safety Planning and Suicide Prevention discussed: Yes,  with patient  Have you used any form of tobacco in the last 30 days? (Cigarettes, Smokeless Tobacco, Cigars, and/or Pipes): No  Has patient been referred to the Quitline?: N/A patient is not a smoker  Latrina Guttman, Belenda CruiseKristin L 12/16/2014, 9:35 AM

## 2014-12-17 NOTE — Progress Notes (Signed)
[facility].  Per clinic, they are unable to give any appt out in the next week.

## 2015-02-18 ENCOUNTER — Encounter (HOSPITAL_COMMUNITY): Payer: Self-pay | Admitting: Emergency Medicine

## 2015-02-18 ENCOUNTER — Emergency Department (INDEPENDENT_AMBULATORY_CARE_PROVIDER_SITE_OTHER)
Admission: EM | Admit: 2015-02-18 | Discharge: 2015-02-18 | Disposition: A | Discharge status: Home / Self Care | Payer: Self-pay | Source: Home / Self Care | Attending: Family Medicine | Admitting: Family Medicine

## 2015-02-18 DIAGNOSIS — I10 Essential (primary) hypertension: Secondary | ICD-10-CM

## 2015-02-18 MED ORDER — HYDROCHLOROTHIAZIDE 12.5 MG PO CAPS: 12.5000 mg | ORAL_CAPSULE | Freq: Every day | ORAL | Status: DC

## 2015-02-18 MED ORDER — LOSARTAN POTASSIUM 100 MG PO TABS: 100.0000 mg | ORAL_TABLET | Freq: Every day | ORAL | Status: DC

## 2015-02-18 MED ORDER — CARVEDILOL 12.5 MG PO TABS: 12.5000 mg | ORAL_TABLET | Freq: Two times a day (BID) | ORAL | Status: DC

## 2015-02-18 NOTE — Discharge Instructions (Signed)
We've refilled the three bp medications for one year.  Continue to check your blood pressure at home and come be seen if it's consistently running above 140/90. Daily aerobic exercise and limiting sodium can help to control blood pressure.

## 2015-02-18 NOTE — ED Provider Notes (Signed)
CSN: [id number]     Arrival date & time 02/18/15  1809 History   First MD Initiated Contact with Patient 02/18/15 1910     Chief Complaint  Patient presents with  . Medication Refill   HPI  49 yom presents for bp med refill. Needs losartan, carvedilol, and hctz refilled. BP elevated today. Pt notes has been out of losartan for one week and hctz for 3 weeks. Checks bp at home typically runs 140/80. Denies cp, sob, ha, vision changes. Does not regularly exercise.   Past Medical History  Diagnosis Date  . Hypertension   . 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 Anxiety   . Depression    History reviewed. No pertinent past surgical history. Family History  Problem Relation Age of Onset  . Anxiety disorder Mother    Social History  Substance Use Topics  . Smoking status: Former Games developer  . Smokeless tobacco: None  . Alcohol Use: Yes     Comment: just 11/20/2014    Review of Systems See HPI  Allergies  Review of patient's allergies indicates no known allergies.  Home Medications   Prior to Admission medications   Medication Sig Start Date End Date Taking? Authorizing Provider  simvastatin (ZOCOR) 20 MG tablet Take 1 tablet (20 mg total) by mouth daily at 6 PM. 12/16/14  Yes Adonis Brook, NP  carvedilol (COREG) 12.5 MG tablet Take 1 tablet (12.5 mg total) by mouth 2 (two) times daily with a meal. 02/18/15   Raelyn Ensign, PA  FLUoxetine (PROZAC) 20 MG tablet Take 2 tablets (40 mg total) by mouth daily. 12/16/14   Adonis Brook, NP  hydrochlorothiazide (MICROZIDE) 12.5 MG capsule Take 1 capsule (12.5 mg total) by mouth daily. 02/18/15   Raelyn Ensign, PA  hydrOXYzine (ATARAX/VISTARIL) 25 MG tablet Take 1 tablet (25 mg total) by mouth every 6 (six) hours as needed for anxiety. 12/16/14   Adonis Brook, NP  losartan (COZAAR) 100 MG tablet Take 1 tablet (100 mg total) by mouth daily. 02/18/15   Raelyn Ensign, PA  thiamine 100 MG tablet Take 1 tablet (100 mg total) by mouth daily. 12/16/14    Adonis Brook, NP   Meds Ordered and Administered this Visit  Medications - No data to display  BP 179/106 mmHg  Pulse 104  Temp(Src) 97.6 F (36.4 C) (Oral)  Resp 16  SpO2 100% No data found.   Physical Exam  Constitutional: He is oriented to person, place, and time.  Cardiovascular: Normal rate, regular rhythm and normal heart sounds.   Neurological: He is alert and oriented to person, place, and time.  Psychiatric: He has a normal mood and affect. His speech is normal and behavior is normal.   ED Course  Procedures (including critical care time)  Labs Review Labs Reviewed - No data to display  Imaging Review No results found.  MDM   1. Essential hypertension    Refilled losartan, hctz, carvedilol. SCr and K normal on bmp last month. Encouraged regular aerobic exercise and limiting sodium. Continue checking bp at home and rtc if running >140/90.   Raelyn Ensign, [place] 02/18/15 [phone]

## 2015-02-18 NOTE — ED Notes (Signed)
The patient presented to the [facility] to request a refill for his losartan and carvedilol medications. The patient stated that he does not currently have a PCP.

## 2016-02-15 ENCOUNTER — Encounter (HOSPITAL_COMMUNITY): Payer: Self-pay | Admitting: *Deleted

## 2016-02-15 ENCOUNTER — Ambulatory Visit (HOSPITAL_COMMUNITY)
Admission: EM | Admit: 2016-02-15 | Discharge: 2016-02-15 | Disposition: A | Discharge status: Home / Self Care | Payer: Self-pay | Attending: Family Medicine | Admitting: Family Medicine

## 2016-02-15 DIAGNOSIS — I10 Essential (primary) hypertension: Secondary | ICD-10-CM

## 2016-02-15 MED ORDER — LISINOPRIL 10 MG PO TABS: 10.0000 mg | ORAL_TABLET | Freq: Every day | ORAL | 0 refills | Status: DC

## 2016-02-15 MED ORDER — CARVEDILOL 12.5 MG PO TABS: 12.5000 mg | ORAL_TABLET | Freq: Two times a day (BID) | ORAL | 0 refills | Status: DC

## 2016-02-15 NOTE — ED Provider Notes (Signed)
CSN: [id number]     Arrival date & time 02/15/16  1839 History   None    Chief Complaint  Patient presents with  . Medication Refill   (Consider location/radiation/quality/duration/timing/severity/associated sxs/prior Treatment) Patient has hx of HTN and he has just moved here and has not PCP and he has just ran out of bp meds.   The history is provided by the patient.  Medication Refill  Medications/supplies requested:  Lisinopril and carvedilol Reason for request:  Clinic/provider not available and medications ran out Medications taken before: yes - see home medications   Patient has complete original prescription information: yes     Past Medical History:  Diagnosis Date  . Anxiety   . 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 Depression   . Hypertension    History reviewed. No pertinent surgical history. Family History  Problem Relation Age of Onset  . Anxiety disorder Mother    Social History  Substance Use Topics  . Smoking status: Former Games developer  . Smokeless tobacco: Not on file  . Alcohol use Yes     Comment: just 11/20/2014    Review of Systems  Constitutional: Negative.   HENT: Negative.   Eyes: Negative.   Respiratory: Negative.   Cardiovascular: Negative.   Gastrointestinal: Negative.   Endocrine: Negative.   Genitourinary: Negative.   Musculoskeletal: Negative.   Skin: Negative.   Allergic/Immunologic: Negative.   Neurological: Negative.   Hematological: Negative.   Psychiatric/Behavioral: Negative.     Allergies  Review of patient's allergies indicates no known allergies.  Home Medications   Prior to Admission medications   Medication Sig Start Date End Date Taking? Authorizing Provider  carvedilol (COREG) 12.5 MG tablet Take 1 tablet (12.5 mg total) by mouth 2 (two) times daily with a meal. 02/18/15   Raelyn Ensign, PA  carvedilol (COREG) 12.5 MG tablet Take 1 tablet (12.5 mg total) by mouth 2 (two) times daily with a meal. 02/15/16   Deatra Canter, FNP  FLUoxetine (PROZAC) 20 MG tablet Take 2 tablets (40 mg total) by mouth daily. 12/16/14   Adonis Brook, NP  hydrochlorothiazide (MICROZIDE) 12.5 MG capsule Take 1 capsule (12.5 mg total) by mouth daily. 02/18/15   Raelyn Ensign, PA  hydrOXYzine (ATARAX/VISTARIL) 25 MG tablet Take 1 tablet (25 mg total) by mouth every 6 (six) hours as needed for anxiety. 12/16/14   Adonis Brook, NP  lisinopril (PRINIVIL,ZESTRIL) 10 MG tablet Take 1 tablet (10 mg total) by mouth daily. 02/15/16   Deatra Canter, FNP  losartan (COZAAR) 100 MG tablet Take 1 tablet (100 mg total) by mouth daily. 02/18/15   Raelyn Ensign, PA  simvastatin (ZOCOR) 20 MG tablet Take 1 tablet (20 mg total) by mouth daily at 6 PM. 12/16/14   Adonis Brook, NP  thiamine 100 MG tablet Take 1 tablet (100 mg total) by mouth daily. 12/16/14   Adonis Brook, NP   Meds Ordered and Administered this Visit  Medications - No data to display  BP 168/95 (BP Location: Left Arm)   Pulse 66   Temp 98.6 F (37 C) (Oral)   Resp 18   SpO2 100%  No data found.   Physical Exam  Constitutional: He appears well-developed and well-nourished.  HENT:  Head: Normocephalic and atraumatic.  Eyes: Conjunctivae and EOM are normal. Pupils are equal, round, and reactive to light.  Neck: Normal range of motion. Neck supple.  Cardiovascular: Normal rate, regular rhythm and normal heart sounds.   Pulmonary/Chest:  Effort normal and breath sounds normal.  Nursing note and vitals reviewed.   Urgent Care Course   Clinical Course    Procedures (including critical care time)  Labs Review Labs Reviewed - No data to display  Imaging Review No results found.   Visual Acuity Review  Right Eye Distance:   Left Eye Distance:   Bilateral Distance:    Right Eye Near:   Left Eye Near:    Bilateral Near:         MDM   1. Essential hypertension    Carvedilol 12.5mg  one po bid #60 Lisinopril 10mg  one po qd #30  DASH diet  Follow up  with PCP  New PCP numbers given to patient.    Deatra CanterWilliam J Ramon Zanders, FNP 02/15/16 2014

## 2016-02-15 NOTE — ED Triage Notes (Signed)
No   PCP   IS   ALMOST   OUT    OF HIS  BP  MEDS        HE  IS  REQUESTING  REFILLS

## 2016-03-14 ENCOUNTER — Encounter (HOSPITAL_COMMUNITY): Payer: Self-pay | Admitting: Emergency Medicine

## 2016-03-14 ENCOUNTER — Ambulatory Visit (HOSPITAL_COMMUNITY)
Admission: EM | Admit: 2016-03-14 | Discharge: 2016-03-14 | Disposition: A | Discharge status: Home / Self Care | Payer: Self-pay | Attending: Family Medicine | Admitting: Family Medicine

## 2016-03-14 DIAGNOSIS — Z760 Encounter for issue of repeat prescription: Secondary | ICD-10-CM

## 2016-03-14 DIAGNOSIS — I10 Essential (primary) hypertension: Secondary | ICD-10-CM

## 2016-03-14 MED ORDER — LISINOPRIL 10 MG PO TABS: 10.0000 mg | ORAL_TABLET | Freq: Every day | ORAL | 1 refills | Status: DC

## 2016-03-14 MED ORDER — CARVEDILOL 12.5 MG PO TABS: 12.5000 mg | ORAL_TABLET | Freq: Two times a day (BID) | ORAL | 1 refills | Status: DC

## 2016-03-14 NOTE — ED Triage Notes (Signed)
[facility] ref[facility] his medications. Carvedilol 12.5 mg and Lisinopril 10 mg.

## 2016-03-14 NOTE — ED Provider Notes (Signed)
CSN: [id number]     Arrival date & time 03/14/16  1749 History   First MD Initiated Contact with Patient 03/14/16 1846     Chief Complaint  Patient presents with  . Medication Refill   (Consider location/radiation/quality/duration/timing/severity/associated sxs/prior Treatment) HPI Patient has a history of hypertension does not have a primary care provider and is out of medications. States that he hasn't been unable to find a PCP. Have insurance at this time. Medications requested carvedilol and lisinopril. Past Medical History:  Diagnosis Date  . Anxiety   . 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 Depression   . Hypertension    History reviewed. No pertinent surgical history. Family History  Problem Relation Age of Onset  . Anxiety disorder Mother    Social History  Substance Use Topics  . Smoking status: Former Games developermoker  . Smokeless tobacco: Not on file  . Alcohol use Yes     Comment: just 11/20/2014    Review of Systems  Denies: HEADACHE, NAUSEA, ABDOMINAL PAIN, CHEST PAIN, CONGESTION, DYSURIA, SHORTNESS OF BREATH  Allergies  Review of patient's allergies indicates no known allergies.  Home Medications   Prior to Admission medications   Medication Sig Start Date End Date Taking? Authorizing Provider  carvedilol (COREG) 12.5 MG tablet Take 1 tablet (12.5 mg total) by mouth 2 (two) times daily with a meal. 02/18/15  Yes Todd McVeigh, PA  lisinopril (PRINIVIL,ZESTRIL) 10 MG tablet Take 1 tablet (10 mg total) by mouth daily. 02/15/16  Yes Deatra CanterWilliam J Oxford, FNP  carvedilol (COREG) 12.5 MG tablet Take 1 tablet (12.5 mg total) by mouth 2 (two) times daily with a meal. 02/15/16   Deatra CanterWilliam J Oxford, FNP  carvedilol (COREG) 12.5 MG tablet Take 1 tablet (12.5 mg total) by mouth 2 (two) times daily with a meal. 03/14/16   Tharon AquasFrank C Patrick, PA  FLUoxetine (PROZAC) 20 MG tablet Take 2 tablets (40 mg total) by mouth daily. 12/16/14   Adonis BrookSheila Agustin, NP  hydrochlorothiazide (MICROZIDE) 12.5 MG  capsule Take 1 capsule (12.5 mg total) by mouth daily. 02/18/15   Raelyn Ensignodd McVeigh, PA  hydrOXYzine (ATARAX/VISTARIL) 25 MG tablet Take 1 tablet (25 mg total) by mouth every 6 (six) hours as needed for anxiety. 12/16/14   Adonis BrookSheila Agustin, NP  lisinopril (PRINIVIL,ZESTRIL) 10 MG tablet Take 1 tablet (10 mg total) by mouth daily. 03/14/16   Tharon AquasFrank C Patrick, PA  losartan (COZAAR) 100 MG tablet Take 1 tablet (100 mg total) by mouth daily. 02/18/15   Raelyn Ensignodd McVeigh, PA  simvastatin (ZOCOR) 20 MG tablet Take 1 tablet (20 mg total) by mouth daily at 6 PM. 12/16/14   Adonis BrookSheila Agustin, NP  thiamine 100 MG tablet Take 1 tablet (100 mg total) by mouth daily. 12/16/14   Adonis BrookSheila Agustin, NP   Meds Ordered and Administered this Visit  Medications - No data to display  BP 153/90 (BP Location: Left Arm)   Pulse 104   Temp 99 F (37.2 C) (Oral)   Resp 14   SpO2 100%  No data found.   Physical Exam NURSES NOTES AND VITAL SIGNS REVIEWED. CONSTITUTIONAL: Well developed, well nourished, no acute distress HEENT: normocephalic, atraumatic EYES: Conjunctiva normal NECK:normal ROM, supple, no adenopathy PULMONARY:No respiratory distress, normal effort ABDOMINAL: Soft, ND, NT BS+, No CVAT MUSCULOSKELETAL: Normal ROM of all extremities,  SKIN: warm and dry without rash PSYCHIATRIC: Mood and affect, behavior are normal  Urgent Care Course   Clinical Course    Procedures (including critical care time)  Labs Review Labs Reviewed - No data to display  Imaging Review No results found.   Visual Acuity Review  Right Eye Distance:   Left Eye Distance:   Bilateral Distance:    Right Eye Near:   Left Eye Near:    Bilateral Near:       Patient is advised where happy to refill his medications once here in the urgent care is being given a 3 month supply which will give him plenty of time to find a PCP patient acknowledges this information.  MDM   1. Encounter for medication refill     Patient is reassured  that there are no issues that require transfer to higher level of care at this time or additional tests. Patient is advised to continue home symptomatic treatment. Patient is advised that if there are new or worsening symptoms to attend the emergency department, contact primary care provider, or return to UC. Instructions of care provided discharged home in stable condition.    THIS NOTE WAS GENERATED USING A VOICE RECOGNITION SOFTWARE PROGRAM. ALL REASONABLE EFFORTS  WERE MADE TO PROOFREAD THIS DOCUMENT FOR ACCURACY.  I have verbally reviewed the discharge instructions with the patient. A printed AVS was given to the patient.  All questions were answered prior to discharge.      Tharon Aquas, PA 03/14/16 1902

## 2019-02-14 ENCOUNTER — Other Ambulatory Visit: Payer: Self-pay

## 2019-02-14 ENCOUNTER — Encounter (HOSPITAL_COMMUNITY): Payer: Self-pay | Admitting: Emergency Medicine

## 2019-02-14 ENCOUNTER — Emergency Department (HOSPITAL_COMMUNITY)
Admission: EM | Admit: 2019-02-14 | Discharge: 2019-02-14 | Disposition: A | Discharge status: Home / Self Care | Payer: Medicaid Other | Attending: Emergency Medicine | Admitting: Emergency Medicine

## 2019-02-14 DIAGNOSIS — Z79899 Other long term (current) drug therapy: Secondary | ICD-10-CM | POA: Insufficient documentation

## 2019-02-14 DIAGNOSIS — M546 Pain in thoracic spine: Secondary | ICD-10-CM | POA: Diagnosis present

## 2019-02-14 DIAGNOSIS — Y9389 Activity, other specified: Secondary | ICD-10-CM | POA: Insufficient documentation

## 2019-02-14 DIAGNOSIS — Y929 Unspecified place or not applicable: Secondary | ICD-10-CM | POA: Diagnosis not present

## 2019-02-14 DIAGNOSIS — I10 Essential (primary) hypertension: Secondary | ICD-10-CM | POA: Diagnosis not present

## 2019-02-14 DIAGNOSIS — Z87891 Personal history of nicotine dependence: Secondary | ICD-10-CM | POA: Insufficient documentation

## 2019-02-14 DIAGNOSIS — S29012A Strain of muscle and tendon of back wall of thorax, initial encounter: Secondary | ICD-10-CM | POA: Diagnosis not present

## 2019-02-14 DIAGNOSIS — Y999 Unspecified external cause status: Secondary | ICD-10-CM | POA: Insufficient documentation

## 2019-02-14 DIAGNOSIS — X503XXA Overexertion from repetitive movements, initial encounter: Secondary | ICD-10-CM | POA: Insufficient documentation

## 2019-02-14 MED ORDER — IBUPROFEN 800 MG PO TABS: 800.0000 mg | ORAL_TABLET | Freq: Three times a day (TID) | ORAL | 0 refills | Status: DC

## 2019-02-14 MED ORDER — CYCLOBENZAPRINE HCL 10 MG PO TABS: 10.0000 mg | ORAL_TABLET | Freq: Two times a day (BID) | ORAL | 0 refills | Status: AC | PRN

## 2019-02-14 NOTE — Discharge Instructions (Addendum)
Please review attachment. Take prescriptions as directed and continue light stretching exercises. Follow up with PCP regarding elevated BP.

## 2019-02-14 NOTE — ED Triage Notes (Signed)
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 Pt reports asking for a day off but they told him in order to get off he would need to be medically evaluated. Pt was told to come into the ED for evaluation and documentation purposed.

## 2019-02-14 NOTE — ED Provider Notes (Signed)
[facility] Provider Note   CSN: [id number] Arrival date & time: 02/14/19  0809     History   Chief Complaint Chief Complaint  Patient presents with  . Back Pain    Mid  . Shoulder Pain    left    HPI Ruben Marks is a 53 y.o. male with a history of hypertension and hyperlipidemia who presents to the ED with a 1 day history of left shoulder and upper left back discomfort.  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  He states that he felt some upper back and left shoulder discomfort subsequent to work and took an ibuprofen shortly thereafter with some relief.  This morning, he woke up and the discomfort was even worse and his work requested that he come to the ER for evaluation.  Patient denies any chest pain, altered mental status, headache, blurred vision, shortness of breath, cough, recent illness, abdominal pain, numbness, tingling, or diminished strength. Patient denies alcohol or tobacco use.    HPI  Past Medical History:  Diagnosis Date  . Anxiety   . 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 Depression   . Hypertension     Patient Active Problem List   Diagnosis Date Noted  . Elevated TSH 12/12/2014  . Hyperlipidemia 12/12/2014  . MDD (major depressive disorder), recurrent, severe, with psychosis (HCC) 12/10/2014  . GAD (generalized anxiety disorder) 12/10/2014  . Alcohol use disorder, severe, in early remission (HCC) 12/10/2014  . Moderate benzodiazepine use disorder (HCC) 12/10/2014    History reviewed. No pertinent surgical history.      Home Medications    Prior to Admission medications   Medication Sig Start Date End Date Taking? Authorizing Provider  carvedilol (COREG) 12.5 MG tablet Take 1 tablet (12.5 mg total) by mouth 2 (two) times daily with a meal. 02/18/15   McVeigh, Todd, PA  carvedilol (COREG) 12.5 MG tablet Take 1 tablet (12.5 mg total) by  mouth 2 (two) times daily with a meal. 02/15/16   Deatra Canterxford, William J, FNP  carvedilol (COREG) 12.5 MG tablet Take 1 tablet (12.5 mg total) by mouth 2 (two) times daily with a meal. 03/14/16   Tharon AquasPatrick, Frank C, PA  cyclobenzaprine (FLEXERIL) 10 MG tablet Take 1 tablet (10 mg total) by mouth 2 (two) times daily as needed for up to 10 doses for muscle spasms. 02/14/19   Lorelee NewGreen, Kahliya Fraleigh L, PA-C  FLUoxetine (PROZAC) 20 MG tablet Take 2 tablets (40 mg total) by mouth daily. 12/16/14   Adonis BrookAgustin, Sheila, NP  hydrochlorothiazide (MICROZIDE) 12.5 MG capsule Take 1 capsule (12.5 mg total) by mouth daily. 02/18/15   McVeigh, Todd, PA  hydrOXYzine (ATARAX/VISTARIL) 25 MG tablet Take 1 tablet (25 mg total) by mouth every 6 (six) hours as needed for anxiety. 12/16/14   Adonis BrookAgustin, Sheila, NP  ibuprofen (ADVIL) 800 MG tablet Take 1 tablet (800 mg total) by mouth 3 (three) times daily. 02/14/19   Lorelee NewGreen, Niah Heinle L, PA-C  lisinopril (PRINIVIL,ZESTRIL) 10 MG tablet Take 1 tablet (10 mg total) by mouth daily. 02/15/16   Deatra Canterxford, William J, FNP  lisinopril (PRINIVIL,ZESTRIL) 10 MG tablet Take 1 tablet (10 mg total) by mouth daily. 03/14/16   Tharon AquasPatrick, Frank C, PA  losartan (COZAAR) 100 MG tablet Take 1 tablet (100 mg total) by mouth daily. 02/18/15   McVeigh, Todd, PA  simvastatin (ZOCOR) 20 MG tablet Take 1 tablet (20 mg  total) by mouth daily at 6 PM. 12/16/14   Adonis Brook, NP  thiamine 100 MG tablet Take 1 tablet (100 mg total) by mouth daily. 12/16/14   Adonis Brook, NP    Family History Family History  Problem Relation Age of Onset  . Anxiety disorder Mother     Social History Social History   Tobacco Use  . Smoking status: Former Games developer  . Smokeless tobacco: Never Used  Substance Use Topics  . Alcohol use: Yes    Comment: just 11/20/2014  . Drug use: Yes    Types: Benzodiazepines     Allergies   Patient has no known allergies.   Review of Systems Review of Systems Ten systems are reviewed and are negative  for acute change except as noted in the HPI   Physical Exam Updated Vital Signs BP (!) 174/127 (BP Location: Right Arm)   Pulse 60   Temp 98.3 F (36.8 C) (Oral)   Resp 15   Ht 6\' 1"  (1.854 m)   Wt 99.8 kg   SpO2 100%   BMI 29.03 kg/m   Physical Exam Constitutional:      Appearance: Normal appearance.  HENT:     Head: Normocephalic and atraumatic.  Eyes:     Pupils: Pupils are equal, round, and reactive to light.  Neck:     Musculoskeletal: Normal range of motion. No neck rigidity.  Cardiovascular:     Rate and Rhythm: Normal rate and regular rhythm.     Pulses: Normal pulses.  Pulmonary:     Effort: Pulmonary effort is normal.  Abdominal:     General: Abdomen is flat. There is no distension.     Palpations: Abdomen is soft.     Tenderness: There is no abdominal tenderness.  Musculoskeletal: Normal range of motion.     Comments: ROM, strength, sensation intact. Mild tenderness with palpation in trapezius, left side. Neck ROM intact. Negative SLR. No obvious deformity, redness, or swelling. Negative empty can. Negative speed's test. No bony tenderness.  Skin:    Findings: No bruising or erythema.  Neurological:     Mental Status: He is alert and oriented to person, place, and time.     Sensory: No sensory deficit.     Motor: No weakness.  Psychiatric:        Mood and Affect: Mood normal.        Behavior: Behavior normal.        Thought Content: Thought content normal.      ED Treatments / Results  Labs (all labs ordered are listed, but only abnormal results are displayed) Labs Reviewed - No data to display  EKG None  Radiology No results found.  Procedures Procedures (including critical care time)  Medications Ordered in ED Medications - No data to display   Initial Impression / Assessment and Plan / ED Course  I have reviewed the triage vital signs and the nursing notes.  Pertinent labs & imaging results that were available during my care of the  patient were reviewed by me and considered in my medical decision making (see chart for details).    Patient's history and physical exam is consistent with a left-sided trapezius strain.  Given patient's history of high blood pressure, hyperlipidemia, and elevated blood pressure today, considered more emergent causes of cardiac or pulmonary etiology, but his elevated blood pressures may be attributable to his discomfort and he denies any chest pain or SOB. He does not smoke and denies any recent  illness. Do not suspect rotator cuff injury based on PE. Ibuprofen was used yesterday with some effect.  Will prescribe ibuprofen 800 mg 3 times per day as well as Flexeril at night.  Encouraged light stretching exercises.  Will provide work note for today and tomorrow.  He did not exhibit symptoms of hypertensive emergency this morning, but instructed to follow up with PCP regarding elevated numbers. Patient is understanding agreeable to plan.  Return precautions provided.     Final Clinical Impressions(s) / ED Diagnoses   Final diagnoses:  Upper back strain, initial encounter    ED Discharge Orders         Ordered    ibuprofen (ADVIL) 800 MG tablet  3 times daily     02/14/19 0927    cyclobenzaprine (FLEXERIL) 10 MG tablet  2 times daily PRN     02/14/19 0927           Corena Herter, PA-C 02/14/19 6815    Sherwood Gambler, MD 02/15/19 1104

## 2019-02-14 NOTE — ED Notes (Signed)
Patient verbalizes understanding of discharge instructions. Opportunity for questioning and answers were provided. Armband removed by staff, pt discharged from ED home via POV.  

## 2019-03-29 ENCOUNTER — Emergency Department (HOSPITAL_COMMUNITY)
Admission: EM | Admit: 2019-03-29 | Discharge: 2019-03-29 | Disposition: A | Discharge status: Home / Self Care | Payer: Medicaid Other | Attending: Emergency Medicine | Admitting: Emergency Medicine

## 2019-03-29 ENCOUNTER — Ambulatory Visit (HOSPITAL_COMMUNITY)
Admission: EM | Admit: 2019-03-29 | Discharge: 2019-03-29 | Discharge status: Against Medical Advice | Payer: Medicaid Other

## 2019-03-29 ENCOUNTER — Encounter (HOSPITAL_COMMUNITY): Payer: Self-pay

## 2019-03-29 ENCOUNTER — Other Ambulatory Visit: Payer: Self-pay

## 2019-03-29 ENCOUNTER — Emergency Department (HOSPITAL_COMMUNITY): Payer: Medicaid Other

## 2019-03-29 DIAGNOSIS — I10 Essential (primary) hypertension: Secondary | ICD-10-CM | POA: Insufficient documentation

## 2019-03-29 DIAGNOSIS — W540XXA Bitten by dog, initial encounter: Secondary | ICD-10-CM | POA: Insufficient documentation

## 2019-03-29 DIAGNOSIS — Y93K9 Activity, other involving animal care: Secondary | ICD-10-CM | POA: Insufficient documentation

## 2019-03-29 DIAGNOSIS — Z79899 Other long term (current) drug therapy: Secondary | ICD-10-CM | POA: Insufficient documentation

## 2019-03-29 DIAGNOSIS — Y999 Unspecified external cause status: Secondary | ICD-10-CM | POA: Diagnosis not present

## 2019-03-29 DIAGNOSIS — Y9289 Other specified places as the place of occurrence of the external cause: Secondary | ICD-10-CM | POA: Insufficient documentation

## 2019-03-29 DIAGNOSIS — S61032A Puncture wound without foreign body of left thumb without damage to nail, initial encounter: Secondary | ICD-10-CM | POA: Diagnosis not present

## 2019-03-29 DIAGNOSIS — Z87891 Personal history of nicotine dependence: Secondary | ICD-10-CM | POA: Insufficient documentation

## 2019-03-29 DIAGNOSIS — Z23 Encounter for immunization: Secondary | ICD-10-CM | POA: Diagnosis not present

## 2019-03-29 MED ORDER — TETANUS-DIPHTH-ACELL PERTUSSIS 5-2.5-18.5 LF-MCG/0.5 IM SUSP
0.5000 mL | Freq: Once | INTRAMUSCULAR | Status: AC
  Administered 2019-03-29: 16:00:00 0.5 mL via INTRAMUSCULAR
  Filled 2019-03-29: qty 0.5

## 2019-03-29 MED ORDER — CLONIDINE HCL 0.2 MG PO TABS
0.2000 mg | ORAL_TABLET | Freq: Once | ORAL | Status: AC
Start: 2019-03-29 — End: 2019-03-29
  Administered 2019-03-29: 0.2 mg via ORAL
  Filled 2019-03-29: qty 1

## 2019-03-29 MED ORDER — AMOXICILLIN-POT CLAVULANATE 875-125 MG PO TABS
1.0000 | ORAL_TABLET | Freq: Once | ORAL | Status: AC
  Administered 2019-03-29: 1 via ORAL
  Filled 2019-03-29: qty 1

## 2019-03-29 MED ORDER — AMOXICILLIN-POT CLAVULANATE 875-125 MG PO TABS: 1.0000 | ORAL_TABLET | Freq: Two times a day (BID) | ORAL | 0 refills | Status: DC

## 2019-03-29 NOTE — Discharge Instructions (Addendum)
Your seen today for dog bite.  We cleaned the wound for you and updated your tetanus shot and started you on some antibiotics.  Your x-ray was normal.  Make sure you keep your hand elevated and apply ice as needed for pain and swelling.  Please see your primary care doctor in regards to your blood pressure.  Return to the emergency department if you have any new or worsening symptoms.

## 2019-03-29 NOTE — ED Notes (Signed)
Patient verbalizes understanding of discharge instructions. Opportunity for questioning and answers were provided. Armband removed by staff, pt discharged from ED.  

## 2019-03-29 NOTE — ED Provider Notes (Signed)
[facility] EMERGENCY [facility]PARTMENT Provider Note   CSN: [id number] Arrival date & time: 03/29/19  1530     History   Chief Complaint Chief Complaint  Patient presents with   Animal Bite    HPI Ruben Marks is a 53 y.o. male.     Patient is a 53 year old gentleman with past medical history of hypertension, anxiety, depression presenting to the emergency department for dog bite.  Patient reports that this Wednesday [place] [employer] [place] bit him in the hand.  Reports small puncture wounds around the thumb but no major bleeding.  Reports that today it seemed like his hand was more swollen and painful so he decided to come in.  The rabies status of the dog is unknown but it is under current quarantine with the facility.  Patient's tetanus status is not up-to-date.  Denies any fever, chills.  Reports clear drainage from the wound but no purulent drainage.     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 No pertinent surgical history.      Home Medications    Prior to Admission medications   Medication Sig Start Date End Date Taking? Authorizing Provider  amoxicillin-clavulanate (AUGMENTIN) 875-125 MG tablet Take 1 tablet by mouth every 12 (twelve) hours. 03/29/19   Arlyn DunningMcLean, Jatinder Mcdonagh A, PA-C  carvedilol (COREG) 12.5 MG tablet Take 1 tablet (12.5 mg total) by mouth 2 (two) times daily with a meal. 02/18/15   McVeigh, Tawanna Coolerodd, PA  carvedilol (COREG) 12.5 MG tablet Take 1 tablet (12.5  mg total) by mouth 2 (two) times daily with a meal. 02/15/16   Deatra Canterxford, William J, FNP  carvedilol (COREG) 12.5 MG tablet Take 1 tablet (12.5 mg total) by mouth 2 (two) times daily with a meal. 03/14/16   Tharon AquasPatrick, Frank C, PA  cyclobenzaprine (FLEXERIL) 10 MG tablet Take 1 tablet (10 mg total) by mouth 2 (two) times daily as needed for up to 10 doses for muscle spasms. 02/14/19   Lorelee NewGreen, Garrett L, PA-C  FLUoxetine (PROZAC) 20 MG tablet Take 2 tablets (40 mg total) by mouth daily. 12/16/14   Adonis BrookAgustin, Sheila, NP  hydrochlorothiazide (MICROZIDE) 12.5 MG capsule Take 1 capsule (12.5 mg total) by mouth daily. 02/18/15   McVeigh, Todd, PA  hydrOXYzine (ATARAX/VISTARIL) 25 MG tablet Take 1 tablet (25 mg total) by mouth every 6 (six) hours as needed for anxiety. 12/16/14   Adonis BrookAgustin, Sheila, NP  ibuprofen (ADVIL) 800 MG tablet Take 1 tablet (800 mg total) by mouth 3 (three) times daily. 02/14/19   Lorelee NewGreen, Garrett L, PA-C  lisinopril (PRINIVIL,ZESTRIL) 10 MG tablet Take 1 tablet (10 mg total) by mouth daily. 02/15/16   Deatra Canterxford, William J, FNP  lisinopril (PRINIVIL,ZESTRIL) 10 MG tablet Take 1 tablet (10 mg total) by mouth daily. 03/14/16   Tharon AquasPatrick, Frank C, PA  losartan (COZAAR) 100 MG tablet Take 1 tablet (100 mg total) by mouth daily. 02/18/15   McVeigh, Todd, PA  simvastatin (ZOCOR) 20 MG tablet Take 1  tablet (20 mg total) by mouth daily at 6 PM. 12/16/14   Adonis Brook, NP  thiamine 100 MG tablet Take 1 tablet (100 mg total) by mouth daily. 12/16/14   Adonis Brook, NP    Family History Family History  Problem Relation Age of Onset   Anxiety disorder Mother     Social History Social History   Tobacco Use   Smoking status: Former Smoker   Smokeless tobacco: Never Used  Substance Use Topics   Alcohol use: Not Currently    Comment: just 11/20/2014   Drug use: Not Currently    Types: Benzodiazepines     Allergies   Patient has no known allergies.   Review of Systems Review of Systems    Constitutional: Negative for chills and fever.  HENT: Negative for congestion and sore throat.   Eyes: Negative for pain and visual disturbance.  Respiratory: Negative for cough and shortness of breath.   Cardiovascular: Negative for chest pain and palpitations.  Gastrointestinal: Negative for abdominal pain and vomiting.  Genitourinary: Negative for dysuria and hematuria.  Musculoskeletal: Positive for arthralgias and joint swelling. Negative for back pain and neck pain.  Skin: Positive for wound. Negative for color change and rash.  Neurological: Negative for syncope.  Hematological: Does not bruise/bleed easily.  All other systems reviewed and are negative.    Physical Exam Updated Vital Signs BP (!) 156/100    Pulse 89    Temp 98 F (36.7 C) (Oral)    Resp 16    SpO2 99%   Physical Exam Vitals signs and nursing note reviewed.  Constitutional:      Appearance: Normal appearance.  HENT:     Head: Normocephalic.     Mouth/Throat:     Mouth: Mucous membranes are moist.  Eyes:     Conjunctiva/sclera: Conjunctivae normal.  Cardiovascular:     Rate and Rhythm: Normal rate and regular rhythm.  Pulmonary:     Effort: Pulmonary effort is normal.  Musculoskeletal:     Comments: The base of the left thumb there is small superficial puncture wounds with clear and serous drainage.  About a half a centimeter of surrounding erythema to each.  Patient with normal sensation, strength of the thumb.  Range of motion is restricted due to pain and swelling.  Skin:    General: Skin is dry.  Neurological:     Mental Status: He is alert.  Psychiatric:        Mood and Affect: Mood normal.      ED Treatments / Results  Labs (all labs ordered are listed, but only abnormal results are displayed) Labs Reviewed - No data to display  EKG None  Radiology Dg Hand Complete Left  Result Date: 03/29/2019 CLINICAL DATA:  Pt to ED for dog bite that occurred Wednesday. Pt has puncture wound to  L hand around base of thumb, pt c/o pain to this area and swelling noted as well. EXAM: LEFT HAND - COMPLETE 3+ VIEW COMPARISON:  None. FINDINGS: There is no evidence of fracture or dislocation. There is no evidence of arthropathy or other focal bone abnormality. Soft tissue swelling in the medial aspect of the hand. No radiopaque foreign body identified. IMPRESSION: No acute osseous abnormality in the left hand. Medial soft tissue swelling. No radiopaque foreign body. Electronically Signed   By: Emmaline Kluver M.D.   On: 03/29/2019 17:32    Procedures Procedures (including critical care time)  Medications Ordered in ED Medications  Tdap (BOOSTRIX)  injection 0.5 mL (0.5 mLs Intramuscular Given 03/29/19 1616)  amoxicillin-clavulanate (AUGMENTIN) 875-125 MG per tablet 1 tablet (1 tablet Oral Given 03/29/19 1615)  cloNIDine (CATAPRES) tablet 0.2 mg (0.2 mg Oral Given 03/29/19 1614)     Initial Impression / Assessment and Plan / ED Course  I have reviewed the triage vital signs and the nursing notes.  Pertinent labs & imaging results that were available during my care of the patient were reviewed by me and considered in my medical decision making (see chart for details).  Clinical Course as of Mar 28 1754  Fri Mar 29, 2019  1613 Patient with dog bite 2 days ago.  Dog is under current quarantine for the next 10 days due to unknown rabies status.  Wound does not appear to be infected as it is without purulent drainage or significant erythema.  He does have swelling.  Will obtain x-ray, update tetanus shot, clean the wound out thoroughly and start Augmentin.  Patient is very concerned with his blood pressure which is 205/125 despite taking 2 doses of his blood pressure medication today.  He does admit that he is in pain and feeling anxious being here.  We will give him a dose of clonidine and recheck his blood pressure.   [KM]  7408 X-ray was negative.  Patient remains hypertensive 156/100 but he  does attribute this to still having pain and anxiety.  Patient has follow-up with his primary care doctor on Friday.  He reports he will check his blood pressures at home and if it significantly elevated will follow-up sooner or come to the emergency department if he has any new or worsening symptoms.  Will be sent home with a course of Augmentin.   [KM]    Clinical Course User Index [KM] Alveria Apley, PA-C       Based on review of vitals, medical screening exam, lab work and/or imaging, there does not appear to be an acute, emergent etiology for the patient's symptoms. Counseled pt on good return precautions and encouraged both PCP and ED follow-up as needed.  Prior to discharge, I also discussed incidental imaging findings with patient in detail and advised appropriate, recommended follow-up in detail.  Clinical Impression: 1. Dog bite, initial encounter   2. Hypertension, unspecified type     Disposition: Discharge  Prior to providing a prescription for a controlled substance, I independently reviewed the patient's recent prescription history on the [place]. The patient had no recent or regular prescriptions and was deemed appropriate for a brief, less than 3 day prescription of narcotic for acute analgesia.  This note was prepared with assistance of Systems analyst. Occasional wrong-word or sound-a-like substitutions may have occurred due to the inherent limitations of voice recognition software.   Final Clinical Impressions(s) / ED Diagnoses   Final diagnoses:  Dog bite, initial encounter  Hypertension, unspecified type    ED Discharge Orders         Ordered    amoxicillin-clavulanate (AUGMENTIN) 875-125 MG tablet  Every 12 hours     03/29/19 1755           Kristine Royal 03/29/19 1755    Sherwood Gambler, MD 03/29/19 1914

## 2019-03-29 NOTE — ED Triage Notes (Signed)
Pt from home w/ a c/o a dog bite to left hand that occurred on Wednesday on morning. Three small puncture wounds noted surrounding his thumb (two at the base on his palm and the other on the posterior side). Swelling noted to hand and surrounding tissue.  Additional complaint of HTN. Pt denies headache. No stroke like symptoms noted.

## 2019-04-12 ENCOUNTER — Emergency Department (HOSPITAL_COMMUNITY)
Admission: EM | Admit: 2019-04-12 | Discharge: 2019-04-13 | Disposition: A | Discharge status: Home / Self Care | Payer: Medicaid Other | Attending: Emergency Medicine | Admitting: Emergency Medicine

## 2019-04-12 ENCOUNTER — Encounter (HOSPITAL_COMMUNITY): Payer: Self-pay

## 2019-04-12 ENCOUNTER — Other Ambulatory Visit: Payer: Self-pay

## 2019-04-12 ENCOUNTER — Ambulatory Visit (HOSPITAL_COMMUNITY)
Admission: EM | Admit: 2019-04-12 | Discharge: 2019-04-12 | Disposition: A | Discharge status: Home / Self Care | Payer: Medicaid Other

## 2019-04-12 ENCOUNTER — Ambulatory Visit: Payer: Medicaid Other | Admitting: Family Medicine

## 2019-04-12 ENCOUNTER — Emergency Department (HOSPITAL_COMMUNITY): Payer: Medicaid Other

## 2019-04-12 DIAGNOSIS — R0789 Other chest pain: Secondary | ICD-10-CM

## 2019-04-12 DIAGNOSIS — Z87891 Personal history of nicotine dependence: Secondary | ICD-10-CM | POA: Diagnosis not present

## 2019-04-12 DIAGNOSIS — I10 Essential (primary) hypertension: Secondary | ICD-10-CM | POA: Insufficient documentation

## 2019-04-12 DIAGNOSIS — Z79899 Other long term (current) drug therapy: Secondary | ICD-10-CM | POA: Diagnosis not present

## 2019-04-12 DIAGNOSIS — R000 Tachycardia, unspecified: Secondary | ICD-10-CM

## 2019-04-12 DIAGNOSIS — I160 Hypertensive urgency: Secondary | ICD-10-CM | POA: Diagnosis not present

## 2019-04-12 DIAGNOSIS — R519 Headache, unspecified: Secondary | ICD-10-CM | POA: Diagnosis not present

## 2019-04-12 LAB — BASIC METABOLIC PANEL
Anion gap: 12 (ref 5–15)
BUN: 14 mg/dL (ref 6–20)
CO2: 23 mmol/L (ref 22–32)
Calcium: 9.6 mg/dL (ref 8.9–10.3)
Chloride: 102 mmol/L (ref 98–111)
Creatinine, Ser: 1.01 mg/dL (ref 0.61–1.24)
GFR calc Af Amer: >60 mL/min (ref >60)
GFR calc non Af Amer: >60 mL/min (ref >60)
Glucose, Bld: 117 mg/dL — ABNORMAL HIGH (ref 70–99)
Potassium: 3.4 mmol/L — ABNORMAL LOW (ref 3.5–5.1)
Sodium: 137 mmol/L (ref 135–145)

## 2019-04-12 LAB — CBC
HCT: 48.3 % (ref 39.0–52.0)
Hemoglobin: 17.1 g/dL — ABNORMAL HIGH (ref 13.0–17.0)
MCH: 33 pg (ref 26.0–34.0)
MCHC: 35.4 g/dL (ref 30.0–36.0)
MCV: 93.2 fL (ref 80.0–100.0)
Platelets: 239 10*3/uL (ref 150–400)
RBC: 5.18 MIL/uL (ref 4.22–5.81)
RDW: 12.3 % (ref 11.5–15.5)
WBC: 9 10*3/uL (ref 4.0–10.5)
nRBC: 0 % (ref 0.0–0.2)

## 2019-04-12 LAB — TROPONIN I (HIGH SENSITIVITY)
Troponin I (High Sensitivity): 5 ng/L (ref <18)
Troponin I (High Sensitivity): 4 ng/L (ref <18)

## 2019-04-12 NOTE — ED Notes (Signed)
Patient is being discharged from the Urgent [place] and sent to the Emergency Department via wheelchair by staff. Per [place], [place], patient is stable but in need of higher level of care due to hypertension. Patient is aware and verbalizes understanding of plan of care.  Vitals:   04/12/19 1648  BP: (!) 203/124  Pulse: (!) 109  Resp: 16  Temp: 98.2 F (36.8 C)  SpO2: 99%

## 2019-04-12 NOTE — Discharge Instructions (Addendum)
I recommend going to the Emergency Room immediately to evaluate your severely elevated blood pressure and tachycardia.

## 2019-04-12 NOTE — ED Triage Notes (Signed)
Pt presents to the UC for medication refill carvedilol 25 mg.

## 2019-04-12 NOTE — ED Provider Notes (Signed)
TIME SEEN: 11:49 PM  CHIEF COMPLAINT: Hypertension  HPI: Patient is a 53 year old male with history of hypertension who presents to the emergency department from urgent care for concerns for elevated blood pressures.  States he went to urgent care because he needed a refill of his medications.  Was on Coreg 25 mg BID and was running out so halved it for the past four days.  Was on lisinopril 40 mg daily.  Stopped 6 months ago because ran out of meds and didn't see PCP due to COVID.  PCP appointment scheduled for November 20th with Dr. Jillyn HiddenFulp.  [place]  States he did have some chest pain earlier today that felt like heartburn.  Is now gone.  No shortness of breath, nausea, vomiting, diaphoresis or dizziness.  Is having some frontal headache that he describes as a 4/10.  No vision changes, numbness, weakness, speech changes.  ROS: See HPI Constitutional: no fever  Eyes: no drainage  ENT: no runny nose   Cardiovascular:  no chest pain  Resp: no SOB  GI: no vomiting GU: no dysuria Integumentary: no rash  Allergy: no hives  Musculoskeletal: no leg swelling  Neurological: no slurred speech ROS otherwise negative  PAST MEDICAL HISTORY/PAST SURGICAL HISTORY:  Past Medical History:  Diagnosis Date  . Anxiety   . 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 Depression   . Hypertension     MEDICATIONS:  Prior to Admission medications   Medication Sig Start Date End Date Taking? Authorizing Provider  carvedilol (COREG) 25 MG tablet Take 25 mg by mouth 2 (two) times daily with a meal.    [provider]  cyclobenzaprine (FLEXERIL) 10 MG tablet Take 1 tablet (10 mg total) by mouth 2 (two) times daily as needed for up to 10 doses for muscle spasms. 02/14/19   Lorelee NewGreen, Garrett L, PA-C  hydrochlorothiazide (MICROZIDE) 12.5 MG capsule Take 1 capsule (12.5 mg total) by mouth daily. 02/18/15 04/12/19  McVeigh, Tawanna Coolerodd, PA  lisinopril (PRINIVIL,ZESTRIL) 10 MG tablet Take 1  tablet (10 mg total) by mouth daily. 03/14/16 04/12/19  Tharon AquasPatrick, Frank C, PA  simvastatin (ZOCOR) 20 MG tablet Take 1 tablet (20 mg total) by mouth daily at 6 PM. 12/16/14 04/12/19  Adonis BrookAgustin, Sheila, NP    ALLERGIES:  No Known Allergies  SOCIAL HISTORY:  Social History   Tobacco Use  . Smoking status: Former Games developermoker  . Smokeless tobacco: Never Used  Substance Use Topics  . Alcohol use: Not Currently    Comment: just 11/20/2014    FAMILY HISTORY: Family History  Problem Relation Age of Onset  . Anxiety disorder Mother     EXAM: BP (!) 209/123 (BP Location: Right Arm)   Pulse 79   Temp 98 F (36.7 C) (Oral)   Resp 15   SpO2 99%  CONSTITUTIONAL: Alert and oriented and responds appropriately to questions. Well-appearing; well-nourished HEAD: Normocephalic EYES: Conjunctivae clear, pupils appear equal, EOMI ENT: normal nose; moist mucous membranes NECK: Supple, no meningismus, no nuchal rigidity, no LAD  CARD: RRR; S1 and S2 appreciated; no murmurs, no clicks, no rubs, no gallops RESP: Normal chest excursion without splinting or tachypnea; breath sounds clear and equal bilaterally; no wheezes, no rhonchi, no rales, no hypoxia or respiratory distress, speaking full sentences ABD/GI: Normal bowel sounds; non-distended; soft, non-tender, no rebound, no guarding, no peritoneal signs, no hepatosplenomegaly BACK:  The back appears normal and is non-tender to palpation, there is no CVA tenderness EXT:  Normal ROM in all joints; non-tender to palpation; no edema; normal capillary refill; no cyanosis, no calf tenderness or swelling    SKIN: Normal color for age and race; warm; no rash NEURO: Moves all extremities equally, normal sensation diffusely, cranial nerves II through XII intact, normal speech, normal gait PSYCH: The patient's mood and manner are appropriate. Grooming and personal hygiene are appropriate.  MEDICAL DECISION MAKING: Patient here with hypertension.  Sent from urgent care  for concerns for hypertensive urgency.  Chest pain now resolved and he has had 2 troponins that have been negative and a clear chest x-ray.  Is complaining of some headache.  Will obtain CT of the head given blood pressures were significantly elevated than 220/100s earlier today.  They are improving slightly on their own.  We will give him his home dose of Coreg and lisinopril and anticipate discharging if work-up unremarkable with refills of these medications.  He has follow-up with the PCP scheduled in November.  We will also check urinalysis to ensure no signs of endorgan damage.  Otherwise labs unremarkable.  Normal creatinine today.  ED PROGRESS: Head CT unremarkable.  Urine shows no sign of endorgan damage.  Blood pressures improved to 130s/90s.   Prior to discharge patient began feeling very lightheaded and diaphoretic when he was getting up to walk.  He was found to be orthostatic.  Likely secondary to restarting blood pressure medications that he has been off for several months.  Will give IV fluids and monitor in the ED.   Patient reports feeling much better after IV fluids.  He is no longer orthostatic and able to ambulate without becoming symptomatic.  Blood pressure is now in the 120s/70s.  He states when just on Coreg alone he is still hypertensive.  We will give him a low-dose of lisinopril 10 mg daily to take with his Coreg 25 mg twice daily.  He has not had any bradycardia and repeat EKG is unremarkable.  He is not having any chest pain or shortness of breath.  No focal neurologic deficits.  He has follow-up scheduled with Dr. Jillyn Hidden in November.   At this time, I do not feel there is any life-threatening condition present. I have reviewed and discussed all results (EKG, imaging, lab, urine as appropriate) and exam findings with patient/family. I have reviewed nursing notes and appropriate previous records.  I feel the patient is safe to be discharged home without further emergent workup  and can continue workup as an outpatient as needed. Discussed usual and customary return precautions. Patient/family verbalize understanding and are comfortable with this plan.  Outpatient follow-up has been provided as needed. All questions have been answered.   EKG Interpretation  Date/Time:  Friday April 12 2019 18:15:32 EDT Ventricular Rate:  84 PR Interval:  168 QRS Duration: 74 QT Interval:  374 QTC Calculation: 441 R Axis:   27 Text Interpretation:  Normal sinus rhythm Normal ECG No significant change since last tracing Confirmed by Rochele Raring [phone]) on 04/12/2019 11:37:20 PM       EKG Interpretation  Date/Time:  Saturday April 13 2019 04:31:01 EDT Ventricular Rate:  60 PR Interval:  168 QRS Duration: 85 QT Interval:  473 QTC Calculation: 473 R Axis:   43 Text Interpretation:  Sinus rhythm No significant change since last tracing Confirmed by Rochele Raring [phone]) on 04/13/2019 4:33:01 AM        [name] was evaluated in Emergency Department on 04/12/2019 for the symptoms described in the  history of present illness. He was evaluated in the context of the global COVID-19 pandemic, which necessitated consideration that the patient might be at risk for infection with the SARS-CoV-2 virus that causes COVID-19. Institutional protocols and algorithms that pertain to the evaluation of patients at risk for COVID-19 are in a state of rapid change based on information released by regulatory bodies including the CDC and federal and state organizations. These policies and algorithms were followed during the patient's care in the ED.    Ward, Delice Bison, DO 04/13/19 [phone]

## 2019-04-12 NOTE — ED Triage Notes (Signed)
Pt endorses being out of carvedilol x 2 days, went to [facility] and sent here due to having some chest discomfort and due to BP being high. Denies dizziness, n/v, shob.

## 2019-04-12 NOTE — ED Provider Notes (Signed)
MRN: [id number] DOB: 03-Jun-1966  Subjective:   Ruben Marks is a 53 y.o. male presenting for medication refill of his carvedilol.  Patient states that he feels very anxious, like his heart is racing. Patient is only taking carvedilol, reports pressures are generally 846N systolic.  He has previously been on losartan, lisinopril, hydrochlorothiazide but is not taking any of these medications currently because "they do not work".  He is on 25 mg twice daily of Coreg.  Patient is currently in a rehab program/facility.  No current facility-administered medications for this encounter.   Current Outpatient Medications:  .  carvedilol (COREG) 25 MG tablet, Take 25 mg by mouth 2 (two) times daily with a meal., Disp: , Rfl:  .  cyclobenzaprine (FLEXERIL) 10 MG tablet, Take 1 tablet (10 mg total) by mouth 2 (two) times daily as needed for up to 10 doses for muscle spasms., Disp: 10 tablet, Rfl: 0   No Known Allergies  Past Medical History:  Diagnosis Date  . Anxiety   . 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 Depression   . Hypertension      History reviewed. No pertinent surgical history.  Review of Systems  Constitutional: Negative for fever and malaise/fatigue.  HENT: Negative for congestion, ear pain, sinus pain and sore throat.   Eyes: Negative for blurred vision, double vision, discharge and redness.  Respiratory: Negative for cough, hemoptysis, shortness of breath and wheezing.   Cardiovascular: Negative for chest pain.  Gastrointestinal: Negative for abdominal pain, diarrhea, nausea and vomiting.  Genitourinary: Negative for dysuria, flank pain and hematuria.  Musculoskeletal: Negative for myalgias.  Skin: Negative for rash.  Neurological: Negative for dizziness, weakness and headaches.  Psychiatric/Behavioral: Negative for depression and substance abuse. The patient is nervous/anxious.    Social History   Tobacco Use  . Smoking status: Former Research scientist (life sciences)  . Smokeless tobacco: Never  Used  Substance Use Topics  . Alcohol use: Not Currently    Comment: just 11/20/2014  . Drug use: Not Currently    Types: Benzodiazepines    Family History  Problem Relation Age of Onset  . Anxiety disorder Mother      Objective:   Vitals: BP (!) 203/124 (BP Location: Right Arm)   Pulse (!) 109   Temp 98.2 F (36.8 C)   Resp 16   SpO2 99%   BP 205/130 on recheck, pulse 112. Recheck by PA-Jarid Sasso.  BP Readings from Last 3 Encounters:  04/12/19 (!) 203/124  03/29/19 (!) 156/100  02/14/19 (!) 160/114   Physical Exam Constitutional:      General: He is not in acute distress.    Appearance: Normal appearance. He is well-developed. He is not ill-appearing, toxic-appearing or diaphoretic.  HENT:     Head: Normocephalic and atraumatic.     Right Ear: External ear normal.     Left Ear: External ear normal.     Nose: Nose normal.     Mouth/Throat:     Mouth: Mucous membranes are moist.     Pharynx: Oropharynx is clear.  Eyes:     General: No scleral icterus.    Extraocular Movements: Extraocular movements intact.     Pupils: Pupils are equal, round, and reactive to light.  Cardiovascular:     Rate and Rhythm: Regular rhythm. Tachycardia present.     Heart sounds: Normal heart sounds. No murmur. No friction rub. No gallop.   Pulmonary:     Effort: Pulmonary effort is normal. No respiratory distress.  Breath sounds: Normal breath sounds. No stridor. No wheezing, rhonchi or rales.  Abdominal:     General: Bowel sounds are normal. There is no distension.     Palpations: Abdomen is soft. There is no mass.     Tenderness: There is no abdominal tenderness. There is no guarding or rebound.  Skin:    General: Skin is warm and dry.  Neurological:     Mental Status: He is alert and oriented to person, place, and time.     Cranial Nerves: No cranial nerve deficit.     Motor: No weakness.     Coordination: Coordination normal.     Deep Tendon Reflexes: Reflexes normal.      Comments: Negative Romberg and pronator drift.  Psychiatric:        Mood and Affect: Mood is anxious.        Behavior: Behavior normal.        Thought Content: Thought content normal.        Judgment: Judgment normal.      Assessment and Plan :   1. Hypertensive urgency   2. Essential hypertension   3. Elevated blood pressure reading in office with diagnosis of hypertension   4. Tachycardia     Patient has severely elevated blood pressure readings and is tachycardic.  I cannot safely manage this patient as an outpatient.  Recommend that he go to the emergency room for an emergent evaluation of his hypertensive urgency with tachycardia.  Patient will be transported by our nursing staff ASAP.   Wallis Bamberg, PA-C 04/12/19 1725

## 2019-04-13 ENCOUNTER — Emergency Department (HOSPITAL_COMMUNITY): Payer: Medicaid Other

## 2019-04-13 LAB — URINALYSIS, ROUTINE W REFLEX MICROSCOPIC
Bilirubin Urine: NEGATIVE
Glucose, UA: NEGATIVE mg/dL
Hgb urine dipstick: NEGATIVE
Ketones, ur: NEGATIVE mg/dL
Leukocytes,Ua: NEGATIVE
Nitrite: NEGATIVE
Protein, ur: NEGATIVE mg/dL
Specific Gravity, Urine: 1.012 (ref 1.005–1.030)
pH: 6 (ref 5.0–8.0)

## 2019-04-13 MED ORDER — CARVEDILOL 12.5 MG PO TABS
25.0000 mg | ORAL_TABLET | Freq: Once | ORAL | Status: AC
  Administered 2019-04-13: 25 mg via ORAL
  Filled 2019-04-13: qty 2

## 2019-04-13 MED ORDER — CARVEDILOL 25 MG PO TABS: 25.0000 mg | ORAL_TABLET | Freq: Two times a day (BID) | ORAL | 0 refills | Status: DC

## 2019-04-13 MED ORDER — LISINOPRIL 10 MG PO TABS: 10.0000 mg | ORAL_TABLET | Freq: Every day | ORAL | 0 refills | Status: DC

## 2019-04-13 MED ORDER — SODIUM CHLORIDE 0.9 % IV BOLUS (SEPSIS)
1000.0000 mL | Freq: Once | INTRAVENOUS | Status: AC
  Administered 2019-04-13: 1000 mL via INTRAVENOUS

## 2019-04-13 MED ORDER — ACETAMINOPHEN 500 MG PO TABS
1000.0000 mg | ORAL_TABLET | Freq: Once | ORAL | Status: AC
  Administered 2019-04-13: 1000 mg via ORAL
  Filled 2019-04-13: qty 2

## 2019-04-13 MED ORDER — ONDANSETRON 4 MG PO TBDP
4.0000 mg | ORAL_TABLET | Freq: Once | ORAL | Status: AC
  Administered 2019-04-13: 4 mg via ORAL
  Filled 2019-04-13: qty 1

## 2019-04-13 MED ORDER — LISINOPRIL 20 MG PO TABS
40.0000 mg | ORAL_TABLET | Freq: Once | ORAL | Status: AC
  Administered 2019-04-13: 40 mg via ORAL
  Filled 2019-04-13: qty 2

## 2019-04-13 NOTE — ED Notes (Signed)
Discharge instructions discussed with pt. Including follow up care and BP log. Pt verbalized understanding with no questions at this time.

## 2019-04-13 NOTE — ED Notes (Signed)
Pt was unable to remain standing for the second standing blood pressure, complained of dizziness and felt sweaty.

## 2019-04-13 NOTE — ED Notes (Signed)
Pt denies CP at this time. BP decreased from 209/123 to 166/113

## 2019-04-13 NOTE — ED Notes (Signed)
Patient transported to CT 

## 2019-04-13 NOTE — Discharge Instructions (Signed)
I recommend that you buy a blood pressure cuff and keep track of your blood pressure once or twice a day for the next several days before your appointment with Dr. Chapman Fitch so she can determine if she needs to adjust her blood pressure medications.  If you develop chest pain, shortness of breath, severe headache, numbness or weakness on one side of your body, speech or vision changes, please return to the emergency department.

## 2019-04-13 NOTE — ED Notes (Signed)
Pt ambulated around nurses stations in [place] with RN. Pt. Gait stable and was able to ambulate independently.

## 2019-05-10 ENCOUNTER — Other Ambulatory Visit: Payer: Self-pay

## 2019-05-10 ENCOUNTER — Encounter: Payer: Self-pay | Admitting: Family Medicine

## 2019-05-10 ENCOUNTER — Ambulatory Visit: Payer: Medicaid Other | Attending: Family Medicine | Admitting: Family Medicine

## 2019-05-10 VITALS — BP 185/102 | HR 107 | Temp 98.2°F | Ht 74.0 in | Wt 238.4 lb

## 2019-05-10 DIAGNOSIS — I10 Essential (primary) hypertension: Secondary | ICD-10-CM | POA: Diagnosis not present

## 2019-05-10 DIAGNOSIS — Z125 Encounter for screening for malignant neoplasm of prostate: Secondary | ICD-10-CM | POA: Diagnosis not present

## 2019-05-10 MED ORDER — LISINOPRIL 20 MG PO TABS: 20.0000 mg | ORAL_TABLET | Freq: Every day | ORAL | 4 refills | Status: AC

## 2019-05-10 MED ORDER — CARVEDILOL 25 MG PO TABS: 25.0000 mg | ORAL_TABLET | Freq: Two times a day (BID) | ORAL | 4 refills | Status: AC

## 2019-05-10 NOTE — Progress Notes (Signed)
New Patient Office Visit  Subjective:  Patient ID: Ruben Marks, male    DOB: [birth year]  Age: 53 y.o. MRN: [id number]  CC:  Chief Complaint  Patient presents with  . New Patient (Initial Visit)    HPI Ruben Marks, 53 year old Caucasian male who presents to the office to establish care.  He reports medical history significant for hypertension.  He is status post emergency department visit on 04/12/2019 at which time he was told that his blood pressure was so high that he needed to be evaluated in the emergency department.  Patient states that at that time he had been out of his carvedilol which was causing him to not only have an increased blood pressure but also increased heart rate.  He also reports that he tends to get anxious when he goes to see doctors and he believes that this also contributed to increased blood pressure and increased heart rate.  He also reports that due to COVID-19, he had been trying to stretch out his medications by taking half doses of the medicines.  He thinks that he ran out of the carvedilol/Coreg about a week prior to his urgent care/ED visit and he also was previously on lisinopril which he ran out of about 6 months ago.  He was having some issues with frontal headaches prior to going to the urgent care than ED for evaluation.  On review of chart, patient's blood pressure in the emergency room upon presentation was 209/123.  He was also evaluated for chest pain in the emergency department and had negative troponins x2.  As patient's blood pressure per ED notes was improving without interventions, he was discharged on home dose of Coreg and lisinopril.  He also had head CT which was unremarkable and urinalysis showed no signs of endorgan damage and blood pressure is improved to 130s over 90s prior to discharge.  He also developed sensation of lightheadedness and became diaphoretic when getting up to walk at time of hospital discharge and he was given IV fluids due to  possible orthostatic hypotension from lowering of his blood pressure.  After IV fluids, his blood pressure was 1 in the 120s over 70s and his lisinopril dose was decreased to 10 mg with Coreg 25 mg twice daily to be continued.        At today's visit, he believes that he forgot to take the lisinopril today but did take Coreg this morning but has not taken the second dose.  He also believes that his blood pressure and heart rate might be elevated today due to feeling anxious at the doctor's office.  He denies any other significant medical issues other than his hypertension.  He has had no further issues with chest pain or palpitations, no shortness of breath or cough, no fever or chills.  He denies any sore throat, no loss of sense of taste or smell and has had no exposure of which she is aware to anyone with COVID-19.  He reports that he is relatively new to the area as he moved here in July.  He currently works for the [employer].  He has had some recent dog bites and scratches and states that his tetanus is up-to-date after receiving a dog bite within the past 4 to 5 weeks.  He denies any current issues with headaches or dizziness related to his blood pressure and no episodes of focal numbness or weakness.   Past Medical History:  Diagnosis Date  .  Anxiety   . 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 Depression   . Hypertension     Past Surgical History:  Procedure Laterality Date  . NO PAST SURGERIES      Family History  Problem Relation Age of Onset  . Anxiety disorder Mother   . Prostate cancer Father   . Esophageal cancer Father   . Hypertension Sister     Social History   Socioeconomic History  . Marital status: Single    Spouse name: Not on file  . Number of children: Not on file  . Years of education: Not on file  . Highest education level: Not on file  Occupational History  . Not on file  Social Needs  . Financial resource strain: Not on file    . Food insecurity    Worry: Not on file    Inability: Not on file  . Transportation needs    Medical: Not on file    Non-medical: Not on file  Tobacco Use  . Smoking status: Former Games developermoker  . Smokeless tobacco: Never Used  Substance and Sexual Activity  . Alcohol use: Not Currently    Comment: just 11/20/2014  . Drug use: Not Currently    Types: Benzodiazepines  . Sexual activity: Never  Lifestyle  . Physical activity    Days per week: Not on file    Minutes per session: Not on file  . Stress: Not on file  Relationships  . Social Musicianconnections    Talks on phone: Not on file    Gets together: Not on file    Attends religious service: Not on file    Active member of club or organization: Not on file    Attends meetings of clubs or organizations: Not on file    Relationship status: Not on file  . Intimate partner violence    Fear of current or ex partner: Not on file    Emotionally abused: Not on file    Physically abused: Not on file    Forced sexual activity: Not on file  Other Topics Concern  . Not on file  Social History Narrative  . Not on file    ROS Review of Systems  Constitutional: Negative for chills and fever.  HENT: Negative for sore throat and trouble swallowing.   Eyes: Negative for photophobia and visual disturbance.  Respiratory: Negative for cough and shortness of breath.   Cardiovascular: Negative for chest pain, palpitations and leg swelling.  Gastrointestinal: Negative for abdominal pain, blood in stool, constipation, diarrhea and nausea.  Endocrine: Negative for cold intolerance, heat intolerance, polydipsia, polyphagia and polyuria.  Genitourinary: Negative for dysuria and frequency.  Musculoskeletal: Positive for arthralgias. Negative for back pain and gait problem.  Neurological: Negative for dizziness and headaches.  Hematological: Negative for adenopathy. Does not bruise/bleed easily.  Psychiatric/Behavioral: Negative for self-injury and  suicidal ideas. The patient is nervous/anxious.     Objective:   Today's Vitals: BP (!) 185/102 (BP Location: Right Arm, Patient Position: Sitting, Cuff Size: Large)   Pulse (!) 107   Temp 98.2 F (36.8 C) (Oral)   Ht 6\' 2"  (1.88 m)   Wt 238 lb 6.4 oz (108.1 kg)   SpO2 100%   BMI 30.61 kg/m   Physical Exam Vitals signs and nursing note reviewed.  Constitutional:      Appearance: Normal appearance.     Comments: Overweight for height older male in no acute distress.  He is wearing face mask as  per office COVID-19 protocol  Neck:     Musculoskeletal: Normal range of motion and neck supple.     Vascular: No carotid bruit.  Cardiovascular:     Rate and Rhythm: Normal rate and regular rhythm.  Pulmonary:     Effort: Pulmonary effort is normal.     Breath sounds: Normal breath sounds.  Abdominal:     Palpations: Abdomen is soft.     Tenderness: There is no abdominal tenderness. There is no right CVA tenderness, left CVA tenderness or guarding.  Musculoskeletal: Normal range of motion.        General: No tenderness.     Right lower leg: No edema.     Left lower leg: No edema.  Lymphadenopathy:     Cervical: No cervical adenopathy.  Skin:    General: Skin is warm and dry.     Comments: Patient has some healing abrasions to the back of the left hand; healing wound to the left hand between the thumb and pointer finger  Neurological:     General: No focal deficit present.     Mental Status: He is alert and oriented to person, place, and time.  Psychiatric:        Mood and Affect: Mood normal.        Behavior: Behavior normal.     Assessment & Plan:  1. Essential hypertension Patient's blood pressure was uncontrolled at today's visit but he reports that he has been out of medication.  New prescriptions provided for Coreg 25 mg twice daily as well as lisinopril which was increased from 10 mg to 20 mg daily.  Discussed with the patient that I would also like for him to follow-up  with the clinical pharmacist to make sure that his blood pressure is controlled on the Coreg/carvedilol 25 mg twice daily and lisinopril 20 mg.  Would like to have patient's blood pressure less than 140/90 with eventual goal blood pressure of 130/80 or less.  He is encouraged to follow a low sodium diet and make sure that he is getting plenty of rest, remaining hydrated and exercising on a regular basis.  He should try to avoid the use of nonsteroidal anti-inflammatory medication which can also increase blood pressure.  Also avoid the use of stimulant medication and decongestants which can also raise blood pressure - Amb Referral to Clinical Pharmacist - carvedilol (COREG) 25 MG tablet; Take 1 tablet (25 mg total) by mouth 2 (two) times daily with a meal.  Dispense: 60 tablet; Refill: 4 - lisinopril (ZESTRIL) 20 MG tablet; Take 1 tablet (20 mg total) by mouth daily.  Dispense: 30 tablet; Refill: 4  2. Screening for prostate cancer He agrees to have PSA as a screening test for prostate cancer.  He denies any current issues with nocturia other than when he has had beverages late at night.  He does have a family history of father with prostate cancer. - PSA  Outpatient Encounter Medications as of 05/10/2019  Medication Sig  . carvedilol (COREG) 25 MG tablet Take 1 tablet (25 mg total) by mouth 2 (two) times daily with a meal.  . cyclobenzaprine (FLEXERIL) 10 MG tablet Take 1 tablet (10 mg total) by mouth 2 (two) times daily as needed for up to 10 doses for muscle spasms.  Marland Kitchen lisinopril (ZESTRIL) 20 MG tablet Take 1 tablet (20 mg total) by mouth daily.  . [DISCONTINUED] carvedilol (COREG) 25 MG tablet Take 25 mg by mouth 2 (two) times daily with a  meal.  . [DISCONTINUED] carvedilol (COREG) 25 MG tablet Take 1 tablet (25 mg total) by mouth 2 (two) times daily with a meal.  . [DISCONTINUED] lisinopril (ZESTRIL) 10 MG tablet Take 1 tablet (10 mg total) by mouth daily.  . [DISCONTINUED] hydrochlorothiazide  (MICROZIDE) 12.5 MG capsule Take 1 capsule (12.5 mg total) by mouth daily.  . [DISCONTINUED] simvastatin (ZOCOR) 20 MG tablet Take 1 tablet (20 mg total) by mouth daily at 6 PM.   No facility-administered encounter medications on file as of 05/10/2019.    An After Visit Summary was printed and given to the patient.  Follow-up: Return in about 4 months (around 09/07/2019) for HTN; 2-3 weeks with Franky Macho and . as needed  Cain Saupe, MD

## 2019-05-10 NOTE — Patient Instructions (Signed)
Hypertension, Adult Hypertension is another name for high blood pressure. High blood pressure forces your heart to work harder to pump blood. This can cause problems over time. There are two numbers in a blood pressure reading. There is a top number (systolic) over a bottom number (diastolic). It is best to have a blood pressure that is below 120/80. Healthy choices can help lower your blood pressure, or you may need medicine to help lower it. What are the causes? The cause of this condition is not known. Some conditions may be related to high blood pressure. What increases the risk?  Smoking.  Having type 2 diabetes mellitus, high cholesterol, or both.  Not getting enough exercise or physical activity.  Being overweight.  Having too much fat, sugar, calories, or salt (sodium) in your diet.  Drinking too much alcohol.  Having long-term (chronic) kidney disease.  Having a family history of high blood pressure.  Age. Risk increases with age.  Race. You may be at higher risk if you are African American.  Gender. Men are at higher risk than women before age 45. After age 65, women are at higher risk than men.  Having obstructive sleep apnea.  Stress. What are the signs or symptoms?  High blood pressure may not cause symptoms. Very high blood pressure (hypertensive crisis) may cause: ? Headache. ? Feelings of worry or nervousness (anxiety). ? Shortness of breath. ? Nosebleed. ? A feeling of being sick to your stomach (nausea). ? Throwing up (vomiting). ? Changes in how you see. ? Very bad chest pain. ? Seizures. How is this treated?  This condition is treated by making healthy lifestyle changes, such as: ? Eating healthy foods. ? Exercising more. ? Drinking less alcohol.  Your health care provider may prescribe medicine if lifestyle changes are not enough to get your blood pressure under control, and if: ? Your top number is above 130. ? Your bottom number is above 80.   Your personal target blood pressure may vary. Follow these instructions at home: Eating and drinking   If told, follow the DASH eating plan. To follow this plan: ? Fill one half of your plate at each meal with fruits and vegetables. ? Fill one fourth of your plate at each meal with whole grains. Whole grains include whole-wheat pasta, brown rice, and whole-grain bread. ? Eat or drink low-fat dairy products, such as skim milk or low-fat yogurt. ? Fill one fourth of your plate at each meal with low-fat (lean) proteins. Low-fat proteins include fish, chicken without skin, eggs, beans, and tofu. ? Avoid fatty meat, cured and processed meat, or chicken with skin. ? Avoid pre-made or processed food.  Eat less than 1,500 mg of salt each day.  Do not drink alcohol if: ? Your doctor tells you not to drink. ? You are pregnant, may be pregnant, or are planning to become pregnant.  If you drink alcohol: ? Limit how much you use to:  0-1 drink a day for women.  0-2 drinks a day for men. ? Be aware of how much alcohol is in your drink. In the [place] one drink equals one 12 oz bottle of beer (355 mL), one 5 oz glass of wine (148 mL), or one 1 oz glass of hard liquor (44 mL). Lifestyle   Work with your doctor to stay at a healthy weight or to lose weight. Ask your doctor what the best weight is for you.  Get at least 30 minutes of exercise most   days of the week. This may include walking, swimming, or biking.  Get at least 30 minutes of exercise that strengthens your muscles (resistance exercise) at least 3 days a week. This may include lifting weights or doing Pilates.  Do not use any products that contain nicotine or tobacco, such as cigarettes, e-cigarettes, and chewing tobacco. If you need help quitting, ask your doctor.  Check your blood pressure at home as told by your doctor.  Keep all follow-up visits as told by your doctor. This is important. Medicines  Take over-the-counter and  prescription medicines only as told by your doctor. Follow directions carefully.  Do not skip doses of blood pressure medicine. The medicine does not work as well if you skip doses. Skipping doses also puts you at risk for problems.  Ask your doctor about side effects or reactions to medicines that you should watch for. Contact a doctor if you:  Think you are having a reaction to the medicine you are taking.  Have headaches that keep coming back (recurring).  Feel dizzy.  Have swelling in your ankles.  Have trouble with your vision. Get help right away if you:  Get a very bad headache.  Start to feel mixed up (confused).  Feel weak or numb.  Feel faint.  Have very bad pain in your: ? Chest. ? Belly (abdomen).  Throw up more than once.  Have trouble breathing. Summary  Hypertension is another name for high blood pressure.  High blood pressure forces your heart to work harder to pump blood.  For most people, a normal blood pressure is less than 120/80.  Making healthy choices can help lower blood pressure. If your blood pressure does not get lower with healthy choices, you may need to take medicine. This information is not intended to replace advice given to you by your health care provider. Make sure you discuss any questions you have with your health care provider. Document Released: 11/23/2007 Document Revised: 02/14/2018 Document Reviewed: 02/14/2018 Elsevier Patient Education  2020 Elsevier Inc.  Managing Your Hypertension Hypertension is commonly called high blood pressure. This is when the force of your blood pressing against the walls of your arteries is too strong. Arteries are blood vessels that carry blood from your heart throughout your body. Hypertension forces the heart to work harder to pump blood, and may cause the arteries to become narrow or stiff. Having untreated or uncontrolled hypertension can cause heart attack, stroke, kidney disease, and other  problems. What are blood pressure readings? A blood pressure reading consists of a higher number over a lower number. Ideally, your blood pressure should be below 120/80. The first ("top") number is called the systolic pressure. It is a measure of the pressure in your arteries as your heart beats. The second ("bottom") number is called the diastolic pressure. It is a measure of the pressure in your arteries as the heart relaxes. What does my blood pressure reading mean? Blood pressure is classified into four stages. Based on your blood pressure reading, your health care provider may use the following stages to determine what type of treatment you need, if any. Systolic pressure and diastolic pressure are measured in a unit called mm Hg. Normal  Systolic pressure: below 120.  Diastolic pressure: below 80. Elevated  Systolic pressure: 120-129.  Diastolic pressure: below 80. Hypertension stage 1  Systolic pressure: 130-139.  Diastolic pressure: 80-89. Hypertension stage 2  Systolic pressure: 140 or above.  Diastolic pressure: 90 or above. What health risks   are associated with hypertension? Managing your hypertension is an important responsibility. Uncontrolled hypertension can lead to:  A heart attack.  A stroke.  A weakened blood vessel (aneurysm).  Heart failure.  Kidney damage.  Eye damage.  Metabolic syndrome.  Memory and concentration problems. What changes can I make to manage my hypertension? Hypertension can be managed by making lifestyle changes and possibly by taking medicines. Your health care provider will help you make a plan to bring your blood pressure within a normal range. Eating and drinking   Eat a diet that is high in fiber and potassium, and low in salt (sodium), added sugar, and fat. An example eating plan is called the DASH (Dietary Approaches to Stop Hypertension) diet. To eat this way: ? Eat plenty of fresh fruits and vegetables. Try to fill half  of your plate at each meal with fruits and vegetables. ? Eat whole grains, such as whole wheat pasta, brown rice, or whole grain bread. Fill about one quarter of your plate with whole grains. ? Eat low-fat diary products. ? Avoid fatty cuts of meat, processed or cured meats, and poultry with skin. Fill about one quarter of your plate with lean proteins such as fish, chicken without skin, beans, eggs, and tofu. ? Avoid premade and processed foods. These tend to be higher in sodium, added sugar, and fat.  Reduce your daily sodium intake. Most people with hypertension should eat less than 1,500 mg of sodium a day.  Limit alcohol intake to no more than 1 drink a day for nonpregnant women and 2 drinks a day for men. One drink equals 12 oz of beer, 5 oz of wine, or 1 oz of hard liquor. Lifestyle  Work with your health care provider to maintain a healthy body weight, or to lose weight. Ask what an ideal weight is for you.  Get at least 30 minutes of exercise that causes your heart to beat faster (aerobic exercise) most days of the week. Activities may include walking, swimming, or biking.  Include exercise to strengthen your muscles (resistance exercise), such as weight lifting, as part of your weekly exercise routine. Try to do these types of exercises for 30 minutes at least 3 days a week.  Do not use any products that contain nicotine or tobacco, such as cigarettes and e-cigarettes. If you need help quitting, ask your health care provider.  Control any long-term (chronic) conditions you have, such as high cholesterol or diabetes. Monitoring  Monitor your blood pressure at home as told by your health care provider. Your personal target blood pressure may vary depending on your medical conditions, your age, and other factors.  Have your blood pressure checked regularly, as often as told by your health care provider. Working with your health care provider  Review all the medicines you take with  your health care provider because there may be side effects or interactions.  Talk with your health care provider about your diet, exercise habits, and other lifestyle factors that may be contributing to hypertension.  Visit your health care provider regularly. Your health care provider can help you create and adjust your plan for managing hypertension. Will I need medicine to control my blood pressure? Your health care provider may prescribe medicine if lifestyle changes are not enough to get your blood pressure under control, and if:  Your systolic blood pressure is 130 or higher.  Your diastolic blood pressure is 80 or higher. Take medicines only as told by your health care   provider. Follow the directions carefully. Blood pressure medicines must be taken as prescribed. The medicine does not work as well when you skip doses. Skipping doses also puts you at risk for problems. Contact a health care provider if:  You think you are having a reaction to medicines you have taken.  You have repeated (recurrent) headaches.  You feel dizzy.  You have swelling in your ankles.  You have trouble with your vision. Get help right away if:  You develop a severe headache or confusion.  You have unusual weakness or numbness, or you feel faint.  You have severe pain in your chest or abdomen.  You vomit repeatedly.  You have trouble breathing. Summary  Hypertension is when the force of blood pumping through your arteries is too strong. If this condition is not controlled, it may put you at risk for serious complications.  Your personal target blood pressure may vary depending on your medical conditions, your age, and other factors. For most people, a normal blood pressure is less than 120/80.  Hypertension is managed by lifestyle changes, medicines, or both. Lifestyle changes include weight loss, eating a healthy, low-sodium diet, exercising more, and limiting alcohol. This information is not  intended to replace advice given to you by your health care provider. Make sure you discuss any questions you have with your health care provider. Document Released: 02/29/2012 Document Revised: 09/28/2018 Document Reviewed: 05/04/2016 Elsevier Patient Education  2020 Elsevier Inc.  

## 2019-05-10 NOTE — Progress Notes (Signed)
Patient just took BP medication at 2:00.

## 2019-05-11 LAB — PSA: Prostate Specific Ag, Serum: 0.4 ng/mL (ref 0.0–4.0)

## 2019-05-13 ENCOUNTER — Telehealth: Payer: Self-pay | Admitting: *Deleted

## 2019-05-13 NOTE — Telephone Encounter (Signed)
-----   Message from Antony Blackbird, MD sent at 05/11/2019  1:08 PM EST ----- Normal PSA-prostate specific antigen test (screening tool for prostate cancer)

## 2019-05-13 NOTE — Telephone Encounter (Signed)
Medical Assistant left message on patient's home and cell voicemail. Voicemail states to give a call back to [place] with [facility] at [phone]. !!!Patients Prostate Level is normal!!!

## 2019-05-31 ENCOUNTER — Ambulatory Visit: Payer: Medicaid Other | Admitting: Pharmacist

## 2019-08-12 ENCOUNTER — Emergency Department (HOSPITAL_COMMUNITY): Payer: Medicaid Other

## 2019-08-12 ENCOUNTER — Emergency Department (HOSPITAL_COMMUNITY)
Admission: EM | Admit: 2019-08-12 | Discharge: 2019-08-12 | Disposition: A | Discharge status: Home / Self Care | Payer: Medicaid Other | Attending: Emergency Medicine | Admitting: Emergency Medicine

## 2019-08-12 ENCOUNTER — Other Ambulatory Visit: Payer: Self-pay

## 2019-08-12 DIAGNOSIS — M7918 Myalgia, other site: Secondary | ICD-10-CM | POA: Diagnosis present

## 2019-08-12 DIAGNOSIS — I10 Essential (primary) hypertension: Secondary | ICD-10-CM | POA: Diagnosis not present

## 2019-08-12 DIAGNOSIS — Z87891 Personal history of nicotine dependence: Secondary | ICD-10-CM | POA: Insufficient documentation

## 2019-08-12 DIAGNOSIS — R438 Other disturbances of smell and taste: Secondary | ICD-10-CM | POA: Diagnosis not present

## 2019-08-12 DIAGNOSIS — R197 Diarrhea, unspecified: Secondary | ICD-10-CM | POA: Insufficient documentation

## 2019-08-12 DIAGNOSIS — U071 COVID-19: Secondary | ICD-10-CM

## 2019-08-12 DIAGNOSIS — R05 Cough: Secondary | ICD-10-CM | POA: Diagnosis not present

## 2019-08-12 DIAGNOSIS — Z79899 Other long term (current) drug therapy: Secondary | ICD-10-CM | POA: Diagnosis not present

## 2019-08-12 LAB — CBC WITH DIFFERENTIAL/PLATELET
Abs Immature Granulocytes: 0.02 10*3/uL (ref 0.00–0.07)
Basophils Absolute: 0 10*3/uL (ref 0.0–0.1)
Basophils Relative: 0 %
Eosinophils Absolute: 0.1 10*3/uL (ref 0.0–0.5)
Eosinophils Relative: 1 %
HCT: 45.8 % (ref 39.0–52.0)
Hemoglobin: 15.8 g/dL (ref 13.0–17.0)
Immature Granulocytes: 0 %
Lymphocytes Relative: 23 %
Lymphs Abs: 1.3 10*3/uL (ref 0.7–4.0)
MCH: 33.7 pg (ref 26.0–34.0)
MCHC: 34.5 g/dL (ref 30.0–36.0)
MCV: 97.7 fL (ref 80.0–100.0)
Monocytes Absolute: 0.7 10*3/uL (ref 0.1–1.0)
Monocytes Relative: 12 %
Neutro Abs: 3.7 10*3/uL (ref 1.7–7.7)
Neutrophils Relative %: 64 %
Platelets: 156 10*3/uL (ref 150–400)
RBC: 4.69 MIL/uL (ref 4.22–5.81)
RDW: 12.4 % (ref 11.5–15.5)
WBC: 5.9 10*3/uL (ref 4.0–10.5)
nRBC: 0 % (ref 0.0–0.2)

## 2019-08-12 LAB — BASIC METABOLIC PANEL
Anion gap: 13 (ref 5–15)
BUN: 13 mg/dL (ref 6–20)
CO2: 18 mmol/L — ABNORMAL LOW (ref 22–32)
Calcium: 8.5 mg/dL — ABNORMAL LOW (ref 8.9–10.3)
Chloride: 103 mmol/L (ref 98–111)
Creatinine, Ser: 0.87 mg/dL (ref 0.61–1.24)
GFR calc Af Amer: >60 mL/min (ref >60)
GFR calc non Af Amer: >60 mL/min (ref >60)
Glucose, Bld: 108 mg/dL — ABNORMAL HIGH (ref 70–99)
Potassium: 3.9 mmol/L (ref 3.5–5.1)
Sodium: 134 mmol/L — ABNORMAL LOW (ref 135–145)

## 2019-08-12 LAB — POC SARS CORONAVIRUS 2 AG -  ED: SARS Coronavirus 2 Ag: POSITIVE — AB

## 2019-08-12 MED ORDER — ONDANSETRON HCL 4 MG/2ML IJ SOLN
4.0000 mg | Freq: Once | INTRAMUSCULAR | Status: AC
  Administered 2019-08-12: 4 mg via INTRAVENOUS
  Filled 2019-08-12: qty 2

## 2019-08-12 MED ORDER — SODIUM CHLORIDE 0.9 % IV BOLUS
500.0000 mL | Freq: Once | INTRAVENOUS | Status: AC
  Administered 2019-08-12: 500 mL via INTRAVENOUS

## 2019-08-12 MED ORDER — CARVEDILOL 25 MG PO TABS: 25.0000 mg | ORAL_TABLET | Freq: Two times a day (BID) | ORAL | 0 refills | Status: AC

## 2019-08-12 MED ORDER — LISINOPRIL 20 MG PO TABS: 20.0000 mg | ORAL_TABLET | Freq: Every day | ORAL | 0 refills | Status: AC

## 2019-08-12 MED FILL — LISINOPRIL 20 MG TABLET: 20 | 30 days supply | Qty: 30 | Fill #0

## 2019-08-12 MED FILL — CARVEDILOL 25 MG TABLET: 25 | 15 days supply | Qty: 30 | Fill #0

## 2019-08-12 NOTE — Care Management Note (Signed)
ED CM received call form Ciara Johnson 336 from [facility] stating that the patient will be going to the [employer]  Rm [id number].  States transportation should be here to pick patient up at 7p.  Updated Sarah RN on [place].

## 2019-08-12 NOTE — Progress Notes (Addendum)
2:30pm: CSW sent completed forms to [place] at [facility]. Charlynne Cousins will coordinate with the Health Department for discharge.  2pm: CSW completed documentation to send to [facility] Department for a quarantine period for this patient. CSW gave documents to Tresa Endo, RN to have patient complete.  12pm: CSW received consult for patient to assist with placement in the COVID hotel. CSW contacted [place] at [facility] who will assist with obtaining placement.  CSW will update RN when discharge plan is complete.  Edwin Dada, MSW, LCSW-A Transitions of Care  Clinical Social Worker  [facility]  Medical ICU [phone]

## 2019-08-12 NOTE — ED Notes (Addendum)
Informed the PA and Tresa Endo - RN of pt's POSITIVE covid result.

## 2019-08-12 NOTE — ED Provider Notes (Addendum)
[facility] EMERGENCY DEPARTMENT Provider Note   CSN: [id number] Arrival date & time: 08/12/19  8921     History Chief Complaint  Patient presents with  . Generalized Body Aches    Ruben Marks is a 54 y.o. male with past medical history significant for depression, hypertension, axiety presents to emergency department today with chief complaint of generalized body aches x 3 days.  He also endorses nonproductive cough and 1 episode diarrhea, loss of sense of taste and smell.  He has had decreased p.o. intake since symptom onset because of lack of appetite and intermittent nausea.  His temperature is checked daily and denies any fever but admits to chills.  He took over-the-counter flu medicine last night without any change in his symptoms. Patient is currently a resident at [facility] and another resident recently tested positive for COVID-19.  He denies congestion, sore throat, chest pain, shortness of breath, abdominal pain, urinary frequency, dysuria, gross hematuria, blood in stool. Denies recent antibiotic use, travel, suspicious food intake.  History provided by patient with additional history obtained from chart review.     Past Medical History:  Diagnosis Date  . Anxiety   . 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 Depression   . Hypertension     Patient Active Problem List   Diagnosis Date Noted  . Elevated TSH 12/12/2014  . Hyperlipidemia 12/12/2014  . MDD (major depressive disorder), recurrent, severe, with psychosis (HCC) 12/10/2014  . GAD (generalized anxiety disorder) 12/10/2014  . Alcohol use disorder, severe, in early remission (HCC) 12/10/2014  . Moderate benzodiazepine use disorder (HCC) 12/10/2014    Past Surgical History:  Procedure Laterality Date  . NO PAST SURGERIES         Family History  Problem Relation Age of Onset  . Anxiety disorder Mother   . Prostate cancer Father   . Esophageal cancer Father   . Hypertension Sister       Social History   Tobacco Use  . Smoking status: Former Games developer  . Smokeless tobacco: Never Used  Substance Use Topics  . Alcohol use: Not Currently    Comment: just 11/20/2014  . Drug use: Not Currently    Types: Benzodiazepines    Home Medications Prior to Admission medications   Medication Sig Start Date End Date Taking? Authorizing Provider  carvedilol (COREG) 25 MG tablet Take 1 tablet (25 mg total) by mouth 2 (two) times daily with a meal. 05/10/19   Fulp, Cammie, MD  carvedilol (COREG) 25 MG tablet Take 1 tablet (25 mg total) by mouth 2 (two) times daily with a meal. 08/12/19   Keigen Caddell E, PA-C  cyclobenzaprine (FLEXERIL) 10 MG tablet Take 1 tablet (10 mg total) by mouth 2 (two) times daily as needed for up to 10 doses for muscle spasms. 02/14/19   Lorelee New, PA-C  lisinopril (ZESTRIL) 20 MG tablet Take 1 tablet (20 mg total) by mouth daily. 05/10/19   Fulp, Cammie, MD  hydrochlorothiazide (MICROZIDE) 12.5 MG capsule Take 1 capsule (12.5 mg total) by mouth daily. 02/18/15 04/12/19  McVeigh, Tawanna Cooler, PA  simvastatin (ZOCOR) 20 MG tablet Take 1 tablet (20 mg total) by mouth daily at 6 PM. 12/16/14 04/12/19  Adonis Brook, NP    Allergies    Patient has no known allergies.  Review of Systems   Review of Systems  All other systems are reviewed and are negative for acute change except as noted in the HPI.   Physical  Exam Updated Vital Signs BP (!) 159/105 (BP Location: Left Arm)   Pulse (!) 101   Temp 97.9 F (36.6 C) (Oral)   Resp 16   Ht 6\' 2"  (1.88 m)   Wt 111.1 kg   SpO2 100%   BMI 31.46 kg/m   Physical Exam Vitals and nursing note reviewed.  Constitutional:      General: He is not in acute distress.    Appearance: He is not ill-appearing.  HENT:     Head: Normocephalic and atraumatic.     Right Ear: Tympanic membrane and external ear normal.     Left Ear: Tympanic membrane and external ear normal.     Nose: Nose normal.     Mouth/Throat:      Mouth: Mucous membranes are moist.     Pharynx: Oropharynx is clear.  Eyes:     General: No scleral icterus.       Right eye: No discharge.        Left eye: No discharge.     Extraocular Movements: Extraocular movements intact.     Conjunctiva/sclera: Conjunctivae normal.     Pupils: Pupils are equal, round, and reactive to light.  Neck:     Vascular: No JVD.  Cardiovascular:     Rate and Rhythm: Normal rate and regular rhythm.     Pulses: Normal pulses.          Radial pulses are 2+ on the right side and 2+ on the left side.     Heart sounds: Normal heart sounds.     Comments: Heart rate 85-94 during exam Pulmonary:     Comments: Lungs clear to auscultation in all fields. Symmetric chest rise. No wheezing, rales, or rhonchi. Abdominal:     Comments: Abdomen is soft, non-distended, and non-tender in all quadrants. No rigidity, no guarding. No peritoneal signs.  Musculoskeletal:        General: Normal range of motion.     Cervical back: Normal range of motion.     Right lower leg: No edema.     Left lower leg: No edema.  Skin:    General: Skin is warm and dry.     Capillary Refill: Capillary refill takes less than 2 seconds.     Findings: No rash.  Neurological:     Mental Status: He is oriented to person, place, and time.     GCS: GCS eye subscore is 4. GCS verbal subscore is 5. GCS motor subscore is 6.     Comments: Fluent speech, no facial droop.  Psychiatric:        Behavior: Behavior normal.       ED Results / Procedures / Treatments   Labs (all labs ordered are listed, but only abnormal results are displayed) Labs Reviewed  BASIC METABOLIC PANEL - Abnormal; Notable for the following components:      Result Value   Sodium 134 (*)    CO2 18 (*)    Glucose, Bld 108 (*)    Calcium 8.5 (*)    All other components within normal limits  POC SARS CORONAVIRUS 2 AG -  ED - Abnormal; Notable for the following components:   SARS Coronavirus 2 Ag POSITIVE (*)    All other  components within normal limits  CBC WITH DIFFERENTIAL/PLATELET    EKG EKG Interpretation  Date/Time:  Monday August 12 2019 08:57:59 EST Ventricular Rate:  80 PR Interval:  146 QRS Duration: 84 QT Interval:  362 QTC Calculation: 417  R Axis:   40 Text Interpretation: Normal sinus rhythm Nonspecific T wave abnormality No significant change since last tracing Confirmed by Blanchie Dessert [phone]) on 08/12/2019 10:19:54 AM   Radiology DG Chest Portable 1 View  Result Date: 08/12/2019 CLINICAL DATA:  Generalized body aches, cough and diarrhea. EXAM: PORTABLE CHEST 1 VIEW COMPARISON:  PA and lateral chest 04/12/2019. FINDINGS: The lungs are clear. Heart size is normal. No pneumothorax or pleural effusion. No acute or focal bony abnormality. IMPRESSION: No acute disease. Electronically Signed   By: Inge Rise M.D.   On: 08/12/2019 09:50    Procedures Procedures (including critical care time)  Medications Ordered in ED Medications  sodium chloride 0.9 % bolus 500 mL (0 mLs Intravenous Stopped 08/12/19 1125)  ondansetron (ZOFRAN) injection 4 mg (4 mg Intravenous Given 08/12/19 1008)    ED Course  I have reviewed the triage vital signs and the nursing notes.  Pertinent labs & imaging results that were available during my care of the patient were reviewed by me and considered in my medical decision making (see chart for details).    MDM Rules/Calculators/A&P                      Patient seen and examined. Patient presents awake, alert, hemodynamically stable, afebrile, non toxic. His lungs are clear to auscultation in all fields, normal work of breathing. No abdominal tenderness. No sinus tenderness. Will check basic labs given lack of PO intake over last several days and give small fluid bolus and zofran.   Labs and imaging personally reviewed. CBC without leukocytosis, no anemia. BMP without severe electrolyte derangement, no renal insufficiency.  POC covid  positive. Chest xray without signs to suggest acute infectious processes EKG ordered in triage. Patient denies any chest pain to me, no stemi.   Patient ambulated in the emergency department without respiratory distress, tachycardia, or hypoxia. SpO2 during ambulation >94% on room air. Patient cannot return to [facility] being Covid positive. Social work helped to arrange transport to the [facility], will get prescription for home doses of coreg and lisinopril here in the ED and give to patient to take with him.   The patient appears reasonably screened and/or stabilized for discharge and I doubt any other medical condition or other [facility] requiring further screening, evaluation, or treatment in the ED at this time prior to discharge. The patient is safe for discharge with strict return precautions discussed. Recommend pcp follow up if symptoms persist.   Lauren Aguayo was evaluated in Emergency Department on 08/12/2019 for the symptoms described in the history of present illness. He was evaluated in the context of the global COVID-19 pandemic, which necessitated consideration that the patient might be at risk for infection with the SARS-CoV-2 virus that causes COVID-19. Institutional protocols and algorithms that pertain to the evaluation of patients at risk for COVID-19 are in a state of rapid change based on information released by regulatory bodies including the CDC and federal and state organizations. These policies and algorithms were followed during the patient's care in the ED.   Portions of this note were generated with [employer]. Dictation errors may occur despite best attempts at proofreading.    Final Clinical Impression(s) / ED Diagnoses Final diagnoses:  COVID-19 virus infection    Rx / DC Orders ED Discharge Orders         Ordered    carvedilol (COREG) 25 MG tablet  2 times daily with meals  08/12/19 1241           Sherene Sires, PA-C 08/12/19  1327    Sherene Sires, PA-C 08/12/19 1345    Kathyrn Lass 08/12/19 1458    Gwyneth Sprout, MD 08/12/19 [phone]

## 2019-08-12 NOTE — ED Triage Notes (Addendum)
Pt reports generalized bodyaches, cough, diarrhea. Thinks he may have COVID. NO acute distress noted. Denies recent fevers. VSS. Reports no smell or taste. Currently staying [facility].

## 2019-08-12 NOTE — Discharge Planning (Signed)
EDCM coordinated with [facility] to deliver Rx to pt at bedside prior to discharge today.

## 2019-08-12 NOTE — Discharge Instructions (Addendum)
Thank you for allowing [place] to care for you today.   Please return to the emergency department if you have any new or worsening symptoms.  You tested positive for covid-19 today.   Medications- You can take medications to help treat your symptoms: -Tylenol for fever and body aches. Please take as prescribed on the bottle. -Over the coutner cough medicine  -Flonase or saline nasal spray for nasal congestion -Vitamins as recommended by CDC  Treatment- This is a virus and unfortunately there are no antibitotics approved to treat this virus at this time. It is important to monitor your symptoms closely: -You should have a theremometer at home to check your temperature when feeling feverish. -Use a pulse ox meter to measure your oxygen when feeling short of breath.  -If your fever is over 100.4 despite taking tylenol or if your oxygen level drops below 94% these are reasons to rturn to the emergency department for further evaluation. Please call the emergency department before you come to make [place] aware.    We recommend you self-isolate for 10 days and to inform your work/family/friends that you has the virus.  They will need to self-quarantine for 14 days to monitor for symptoms.    Again: symptoms of shortness of breath, chest pain, difficulty breathing, new onset of confusion, any symptoms that are concerning. If any of these symptoms you should come to emergency department for evaluation.   I hope you feel better soon

## 2019-09-06 ENCOUNTER — Ambulatory Visit: Payer: Medicaid Other | Admitting: Family Medicine

## 2019-09-24 ENCOUNTER — Ambulatory Visit: Payer: Medicaid Other | Attending: Family Medicine | Admitting: Internal Medicine

## 2019-09-24 ENCOUNTER — Other Ambulatory Visit: Payer: Self-pay

## 2019-10-07 ENCOUNTER — Ambulatory Visit: Payer: Medicaid Other | Admitting: Family Medicine

## 2021-09-03 IMAGING — DX DG HAND COMPLETE 3+V*L*
3 series · 3 of 3 positions shown · non-contrast
Comparison: None.

CLINICAL DATA: Pt to ED for dog bite that occurred [REDACTED]. Pt
has puncture wound to L hand around base of thumb, pt c/o pain to
this area and swelling noted as well.

EXAM:
LEFT HAND - COMPLETE 3+ VIEW

[hand pa]
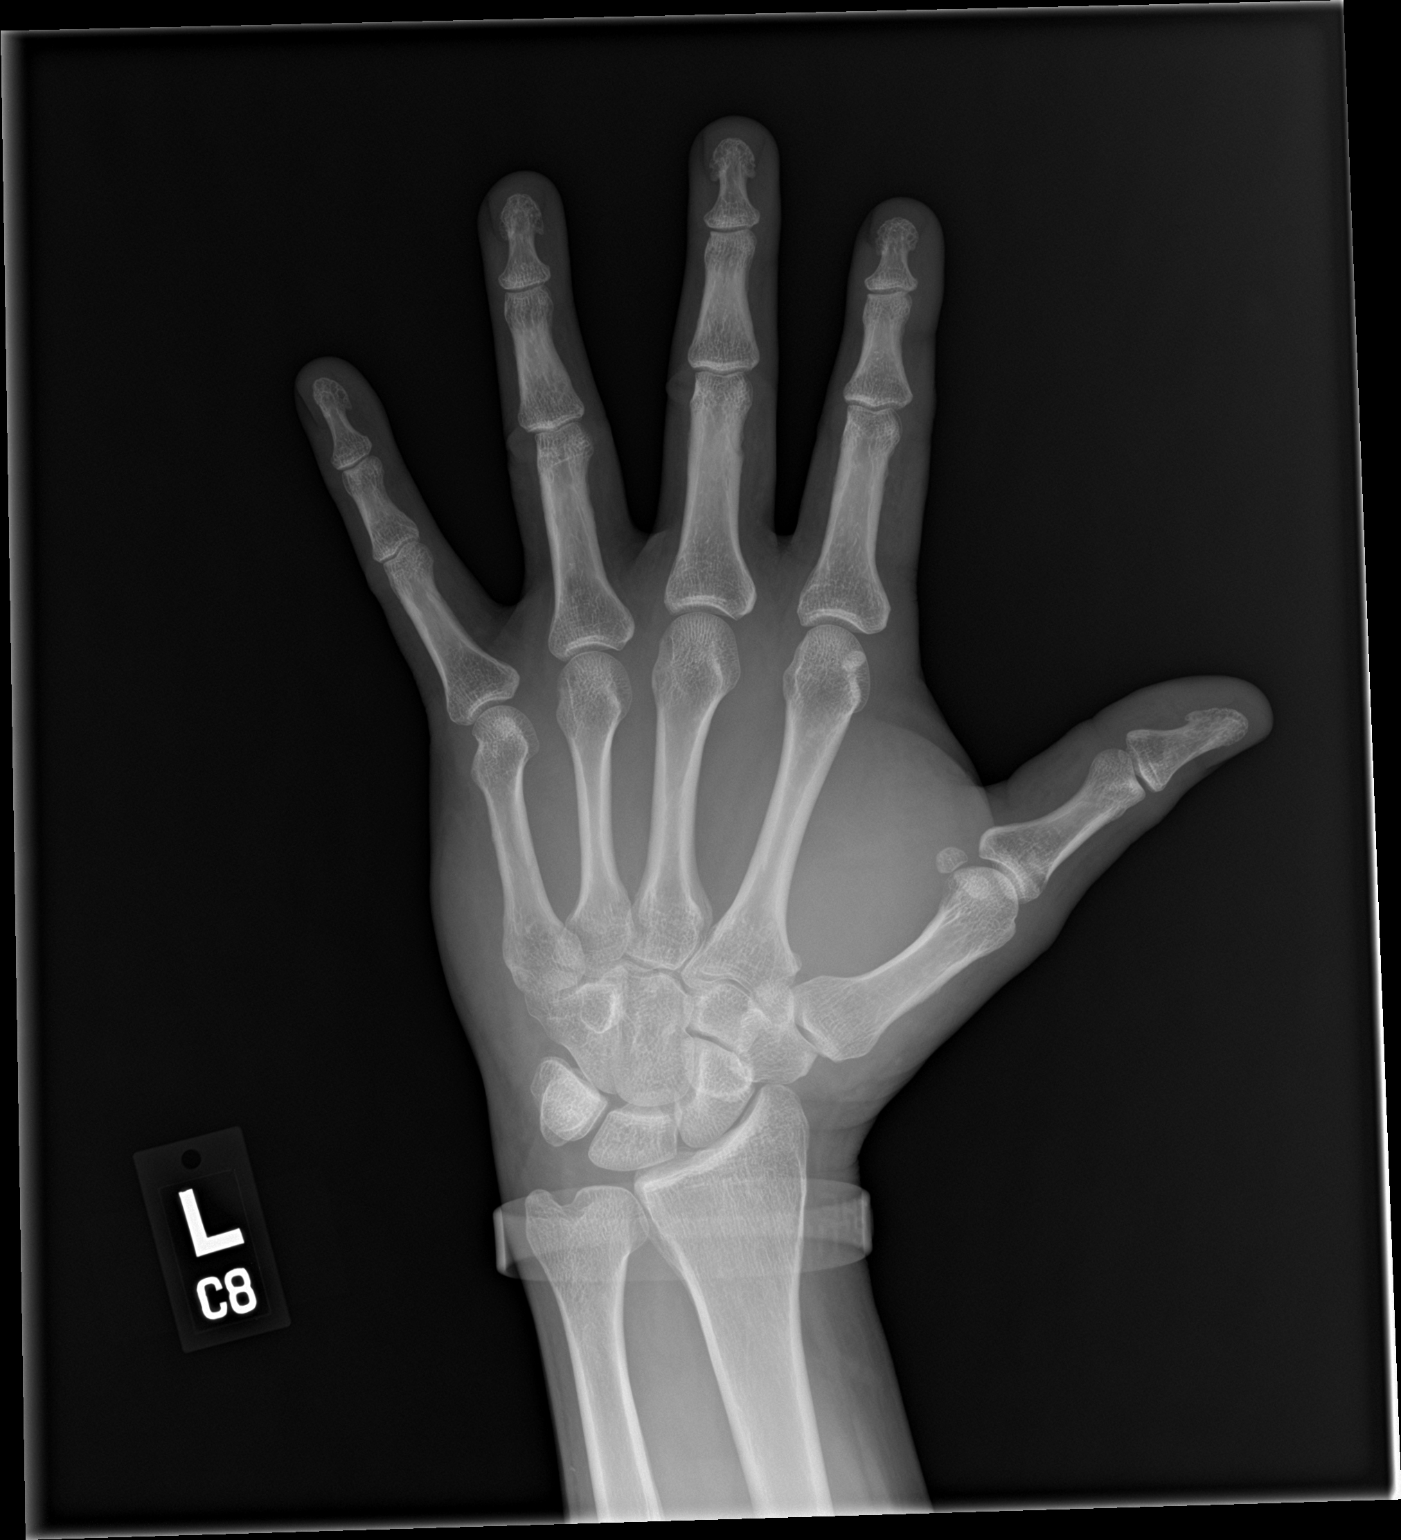

[hand obl]
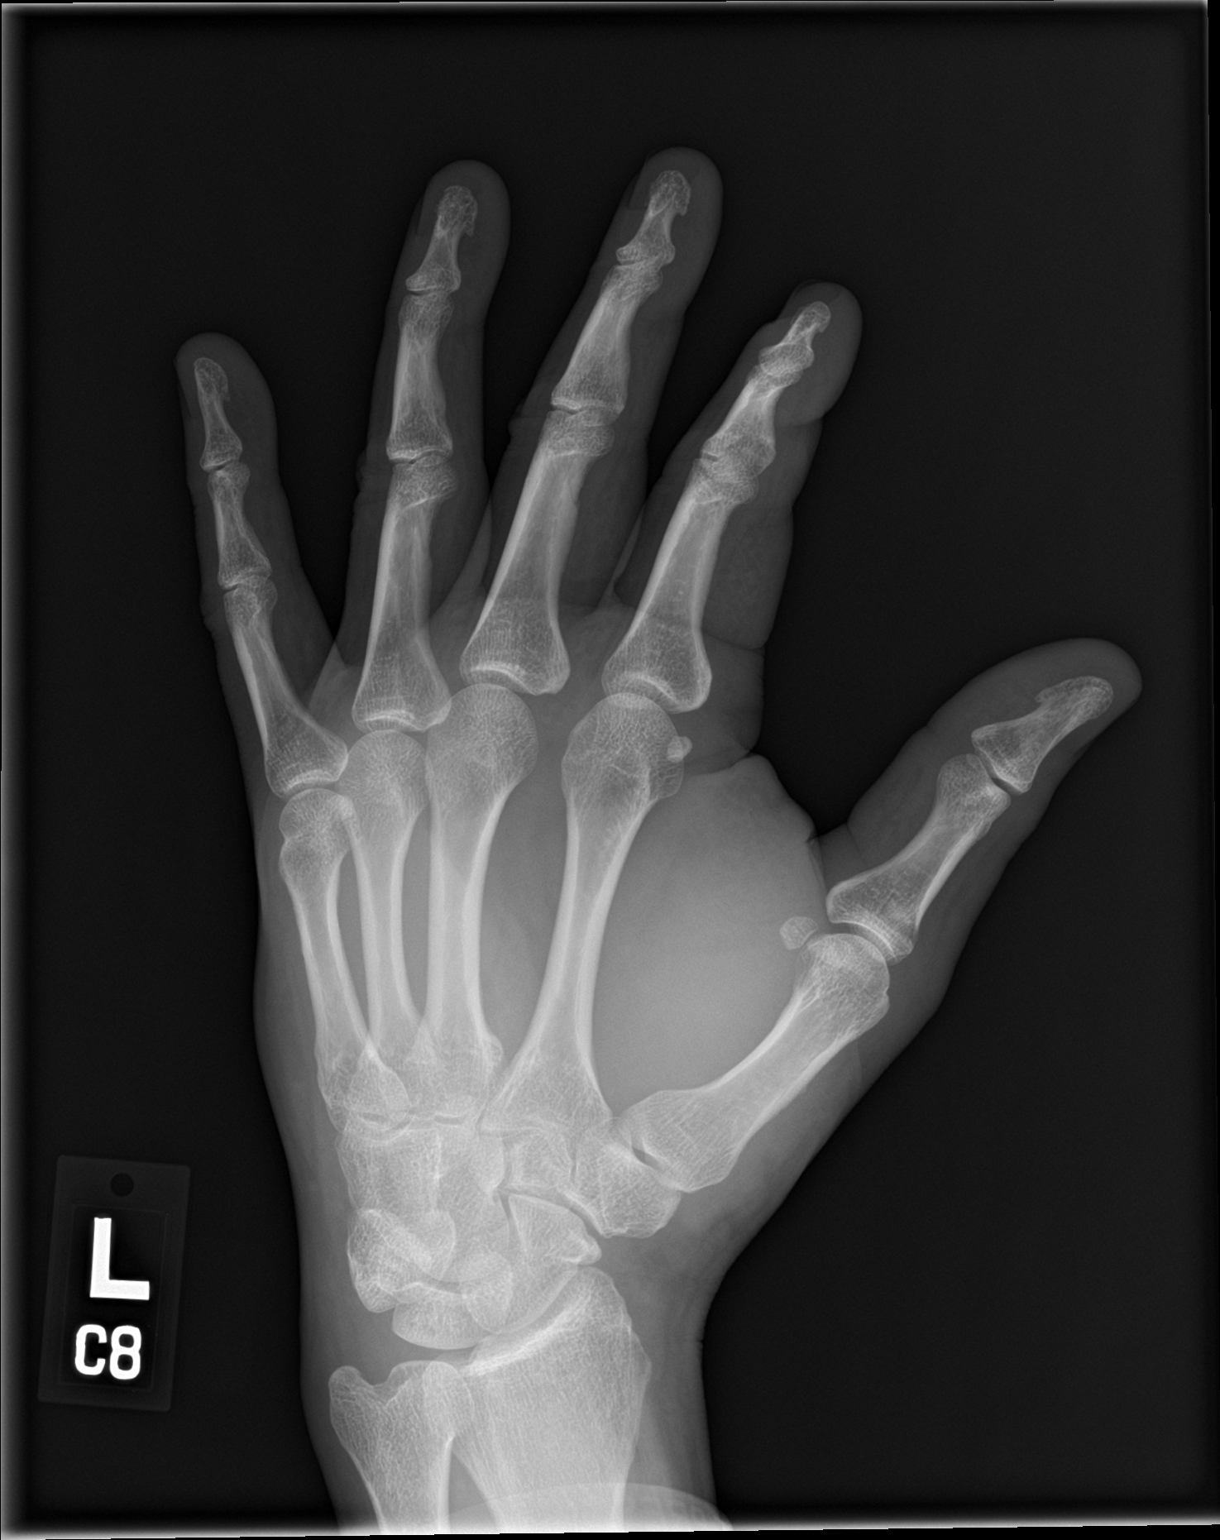

[hand lat]
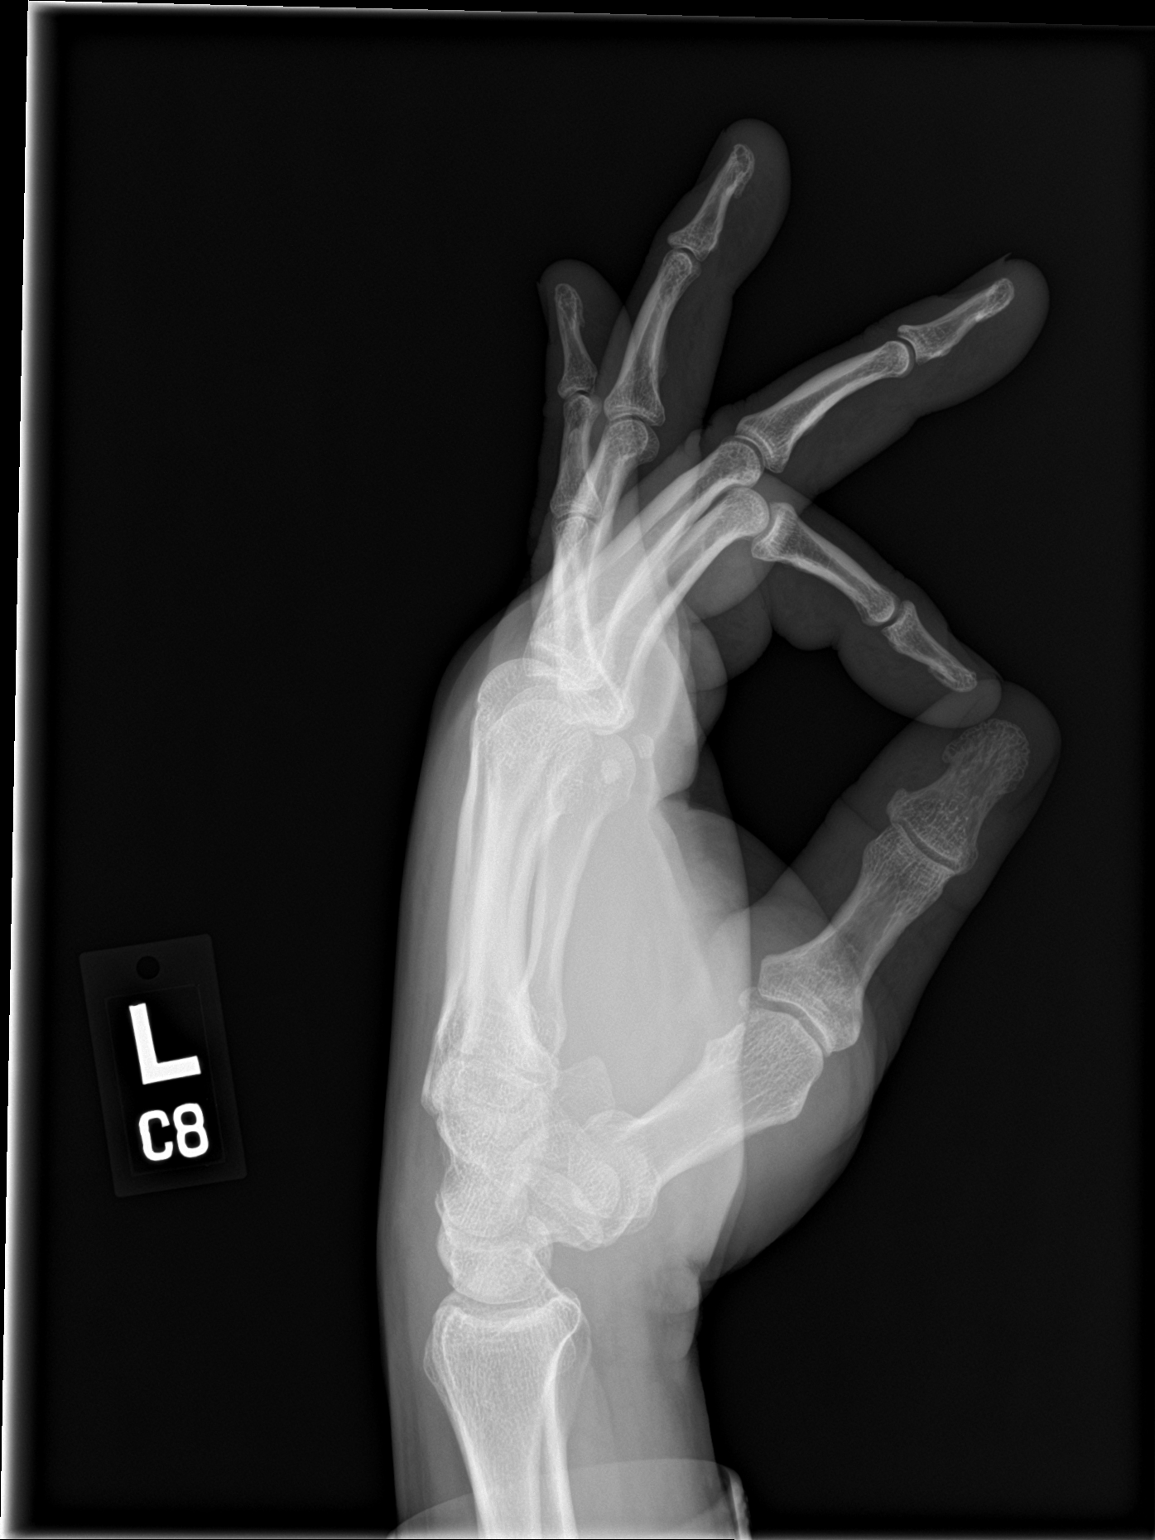

[3 of 3 positions shown; findings below may reference images not displayed]

FINDINGS: There is no evidence of fracture or dislocation. There is no
evidence of arthropathy or other focal bone abnormality. Soft tissue
swelling in the medial aspect of the hand. No radiopaque foreign
body identified.
IMPRESSION: No acute osseous abnormality in the left hand. Medial soft tissue
swelling. No radiopaque foreign body.

## 2021-09-18 IMAGING — CT CT HEAD W/O CM
4 series · 16 of 47 positions shown, 18 images · non-contrast
Comparison: None.

CLINICAL DATA: 53-year-old male with headache and hypertension.

EXAM:
CT HEAD WITHOUT CONTRAST
TECHNIQUE: Contiguous axial images were obtained from the base of the skull
through the vertex without intravenous contrast.

[Series 2: head without · axial · non-contrast · 0.47mm/px · z∈[-82,+38]mm · 7 of 34 slices shown, 9 images]
[im 5/34  brain]
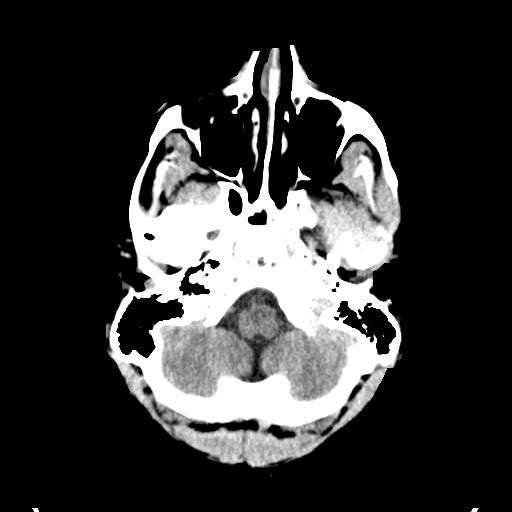
[im 5/34  bone]
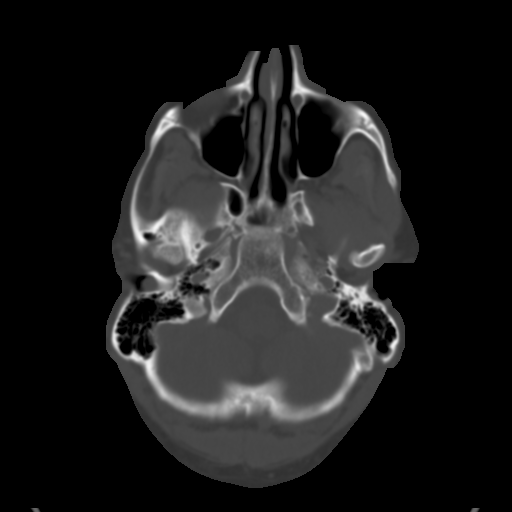
[im 9/34  brain]
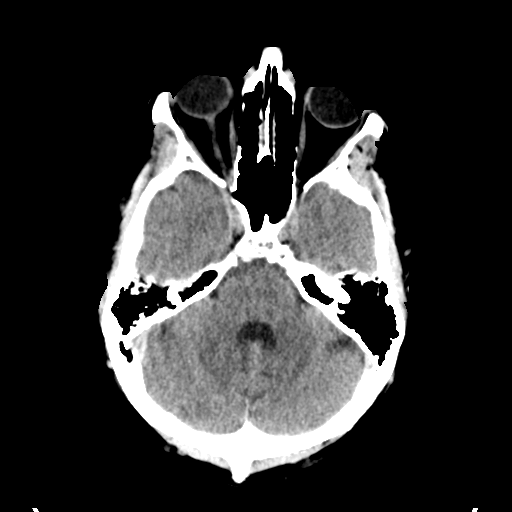
[im 13/34  brain]
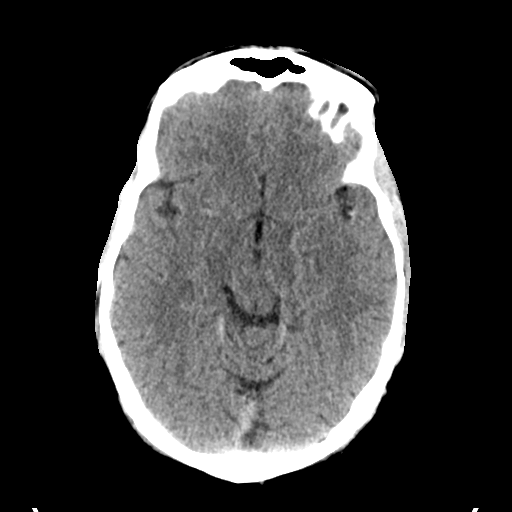
[im 17/34  brain]
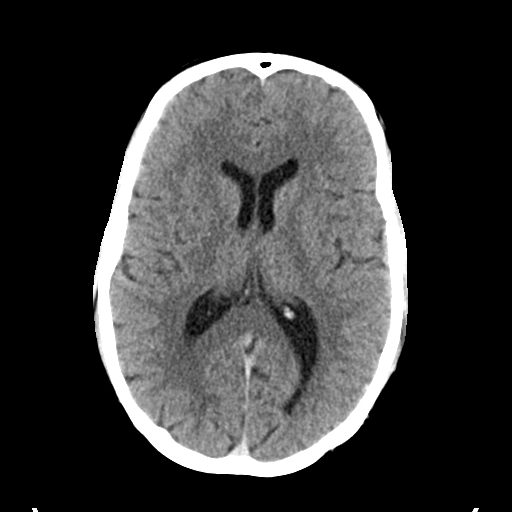
[im 21/34  brain]
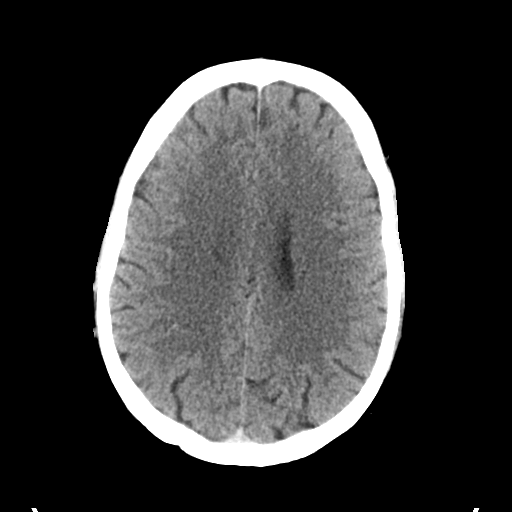
[im 21/34  bone]
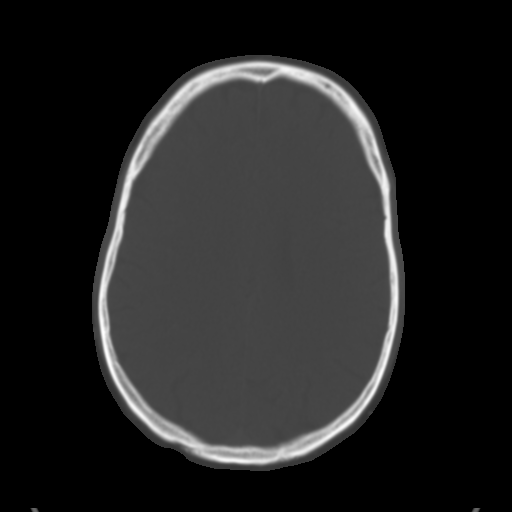
[im 25/34  brain]
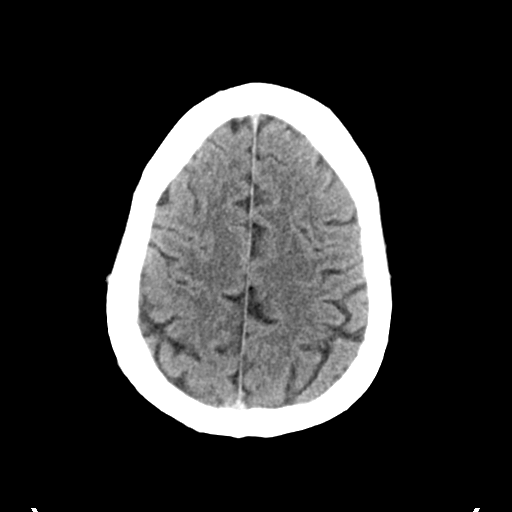
[im 29/34  brain]
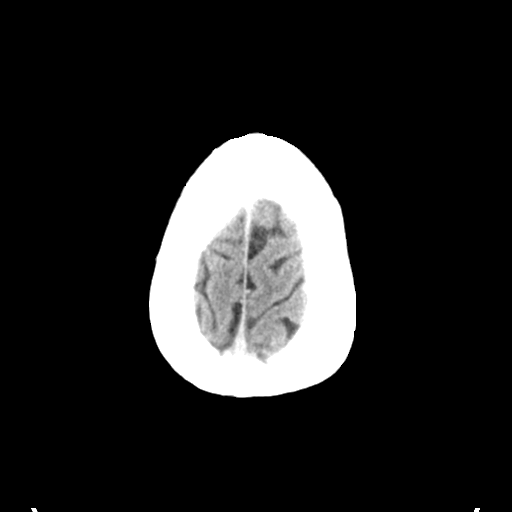

[Series 3: head bone · axial · 0.47mm/px · z∈[-86,-52]mm · 3 of 85 slices shown]
[im 9/85  bone]
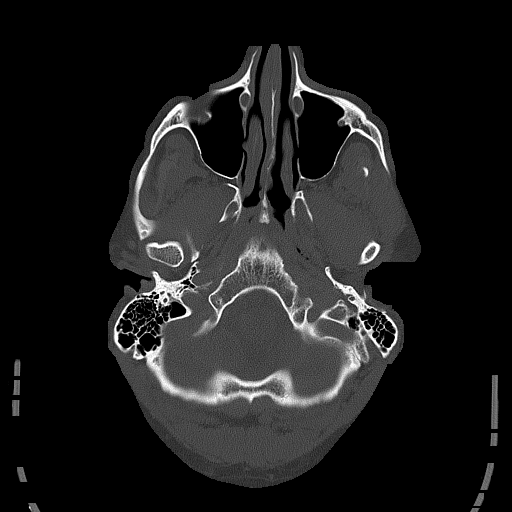
[im 17/85  bone]
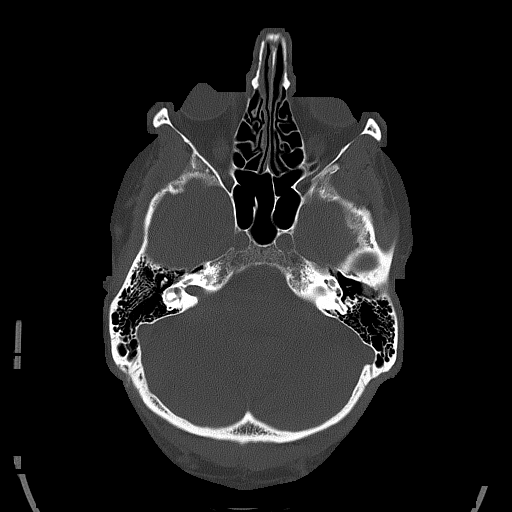
[im 26/85  bone]
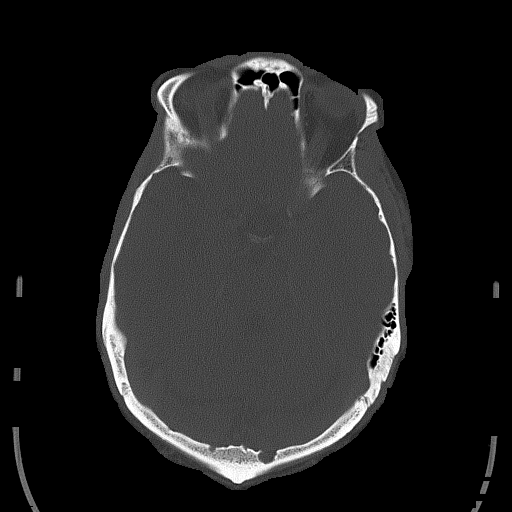

[Series 4: head without cor · coronal · non-contrast · 0.34mm/px · 3 of 78 slices shown]
[im 26/78  brain]
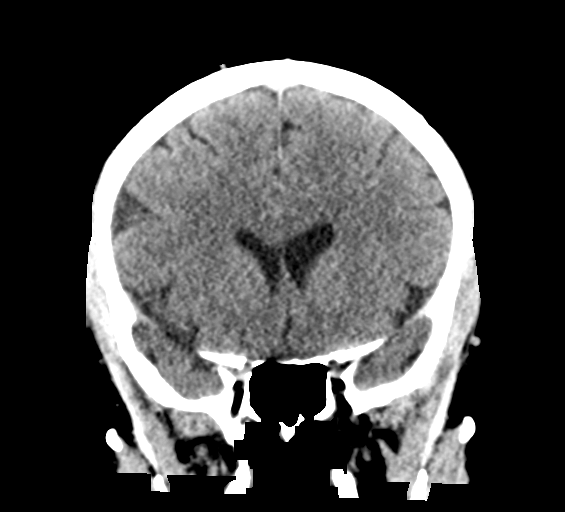
[im 35/78  brain]
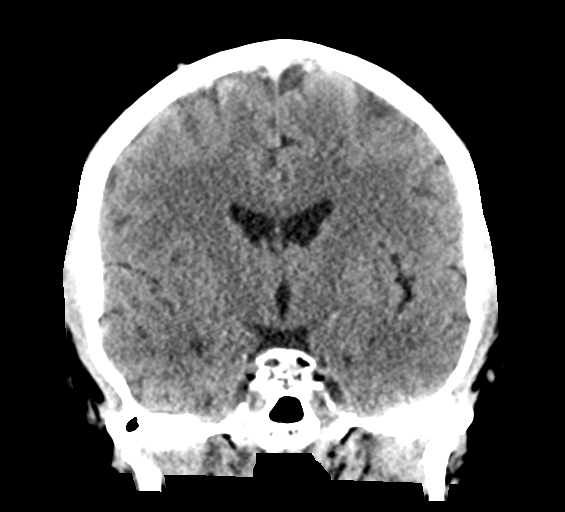
[im 43/78  brain]
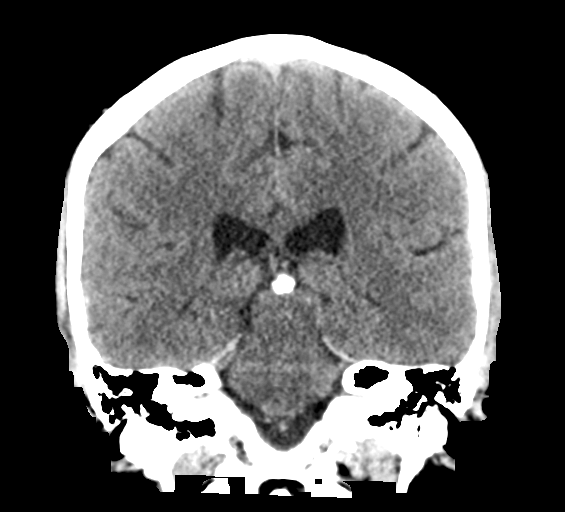

[Series 5: head without sag · sagittal · non-contrast · 0.36mm/px · 3 of 67 slices shown]
[im 23/67  brain]
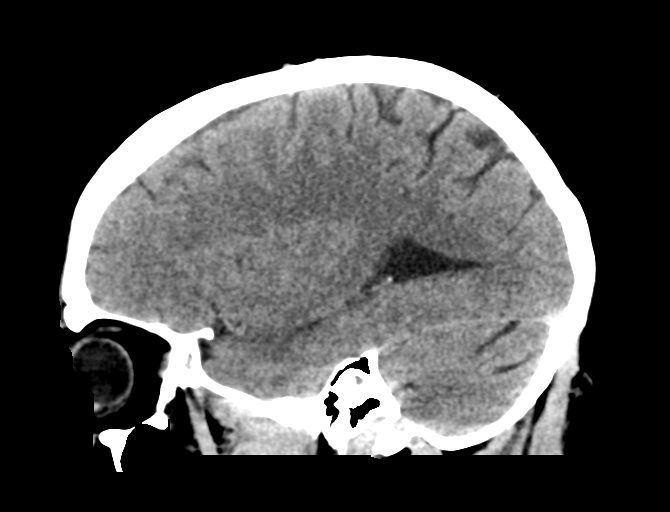
[im 34/67  brain]
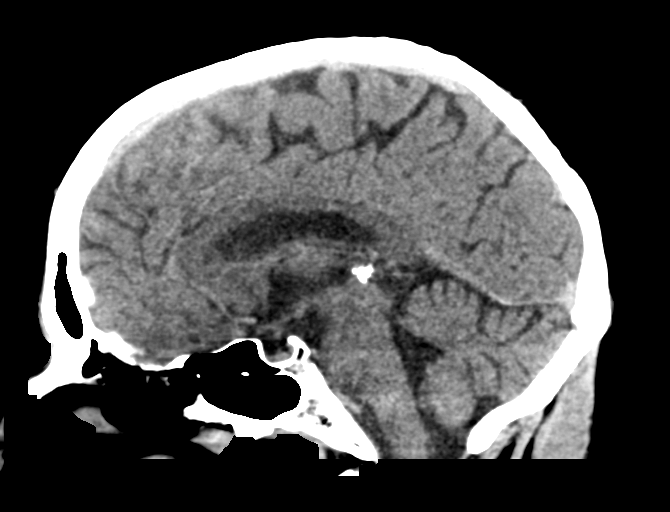
[im 45/67  brain]
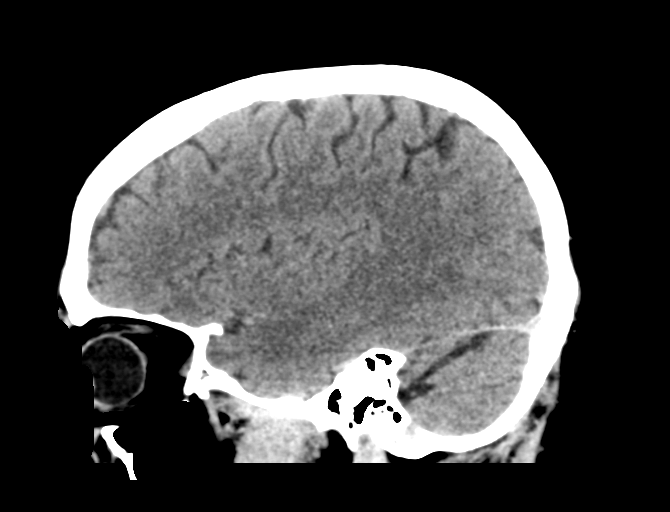

[16 of 47 positions shown; findings below may reference images not displayed]

FINDINGS: Brain: The ventricles and sulci appropriate size for patient's age.
The gray-white matter discrimination is preserved. There is no acute
intracranial hemorrhage. No mass effect or midline shift. No
extra-axial fluid collection.

Vascular: No hyperdense vessel or unexpected calcification.

Skull: Normal. Negative for fracture or focal lesion.

Sinuses/Orbits: No acute finding. Bilateral external auditory canal
cerumen noted.

Other: None
IMPRESSION: Unremarkable noncontrast CT of the brain.
